# Patient Record
Sex: Female | Born: 1970 | Race: White | Hispanic: No | Marital: Married | Smoking: Current every day smoker
Health system: Southern US, Community
[De-identification: ages and names within clinical notes are randomized; demographics above are authoritative.]

## PROBLEM LIST (undated history)

## (undated) DIAGNOSIS — K219 Gastro-esophageal reflux disease without esophagitis: Secondary | ICD-10-CM

## (undated) DIAGNOSIS — K859 Acute pancreatitis without necrosis or infection, unspecified: Secondary | ICD-10-CM

## (undated) DIAGNOSIS — J449 Chronic obstructive pulmonary disease, unspecified: Secondary | ICD-10-CM

## (undated) DIAGNOSIS — F909 Attention-deficit hyperactivity disorder, unspecified type: Secondary | ICD-10-CM

## (undated) DIAGNOSIS — I671 Cerebral aneurysm, nonruptured: Secondary | ICD-10-CM

## (undated) DIAGNOSIS — R569 Unspecified convulsions: Secondary | ICD-10-CM

## (undated) DIAGNOSIS — K7460 Unspecified cirrhosis of liver: Secondary | ICD-10-CM

## (undated) DIAGNOSIS — T1491XA Suicide attempt, initial encounter: Secondary | ICD-10-CM

## (undated) DIAGNOSIS — F419 Anxiety disorder, unspecified: Secondary | ICD-10-CM

## (undated) DIAGNOSIS — F319 Bipolar disorder, unspecified: Secondary | ICD-10-CM

## (undated) HISTORY — PX: BRAIN SURGERY: SHX531

## (undated) HISTORY — PX: OTHER SURGICAL HISTORY: SHX169

---

## 2008-01-05 ENCOUNTER — Ambulatory Visit: Payer: Self-pay | Admitting: Family Medicine

## 2008-01-07 ENCOUNTER — Ambulatory Visit: Payer: Self-pay | Admitting: Family Medicine

## 2008-02-05 ENCOUNTER — Ambulatory Visit: Payer: Self-pay | Admitting: Obstetrics and Gynecology

## 2008-02-12 ENCOUNTER — Ambulatory Visit: Payer: Self-pay | Admitting: Obstetrics and Gynecology

## 2008-03-19 ENCOUNTER — Inpatient Hospital Stay: Payer: Self-pay | Admitting: Unknown Physician Specialty

## 2009-04-30 ENCOUNTER — Inpatient Hospital Stay: Payer: Self-pay | Admitting: Internal Medicine

## 2009-12-16 ENCOUNTER — Inpatient Hospital Stay: Payer: Self-pay | Admitting: Psychiatry

## 2010-06-23 ENCOUNTER — Inpatient Hospital Stay: Payer: Self-pay | Admitting: Psychiatry

## 2010-11-25 ENCOUNTER — Inpatient Hospital Stay: Payer: Self-pay | Admitting: Psychiatry

## 2011-01-05 ENCOUNTER — Emergency Department: Payer: Self-pay | Admitting: Emergency Medicine

## 2011-02-19 ENCOUNTER — Emergency Department: Payer: Self-pay | Admitting: *Deleted

## 2011-03-07 ENCOUNTER — Emergency Department: Payer: Self-pay | Admitting: Emergency Medicine

## 2011-06-19 ENCOUNTER — Emergency Department: Payer: Self-pay | Admitting: *Deleted

## 2011-07-30 ENCOUNTER — Emergency Department: Payer: Self-pay | Admitting: Emergency Medicine

## 2011-07-30 LAB — URINALYSIS, COMPLETE
Nitrite: NEGATIVE
Ph: 5 (ref 4.5–8.0)
Squamous Epithelial: 3

## 2011-08-09 ENCOUNTER — Emergency Department: Payer: Self-pay | Admitting: Emergency Medicine

## 2011-08-09 LAB — URINALYSIS, COMPLETE
Bilirubin,UR: NEGATIVE
Blood: NEGATIVE
Ketone: NEGATIVE
Ph: 5 (ref 4.5–8.0)
Protein: NEGATIVE
RBC,UR: <1 /HPF (ref 0–5)
Specific Gravity: 1.005 (ref 1.003–1.030)
Squamous Epithelial: 8

## 2011-08-09 LAB — DRUG SCREEN, URINE
Amphetamines, Ur Screen: NEGATIVE (ref ?–1000)
Benzodiazepine, Ur Scrn: NEGATIVE (ref ?–200)
Cannabinoid 50 Ng, Ur ~~LOC~~: NEGATIVE (ref ?–50)
MDMA (Ecstasy)Ur Screen: NEGATIVE (ref ?–500)
Methadone, Ur Screen: NEGATIVE (ref ?–300)
Opiate, Ur Screen: NEGATIVE (ref ?–300)

## 2011-08-10 LAB — COMPREHENSIVE METABOLIC PANEL
Anion Gap: 12 (ref 7–16)
BUN: 8 mg/dL (ref 7–18)
Chloride: 110 mmol/L — ABNORMAL HIGH (ref 98–107)
Creatinine: 0.61 mg/dL (ref 0.60–1.30)
EGFR (African American): >60
Glucose: 93 mg/dL (ref 65–99)
Osmolality: 296 (ref 275–301)
SGOT(AST): 51 U/L — ABNORMAL HIGH (ref 15–37)
SGPT (ALT): 44 U/L
Sodium: 150 mmol/L — ABNORMAL HIGH (ref 136–145)
Total Protein: 7.8 g/dL (ref 6.4–8.2)

## 2011-08-10 LAB — CBC
HCT: 44.2 % (ref 35.0–47.0)
HGB: 15 g/dL (ref 12.0–16.0)
MCHC: 33.9 g/dL (ref 32.0–36.0)
MCV: 105 fL — ABNORMAL HIGH (ref 80–100)
WBC: 11 10*3/uL (ref 3.6–11.0)

## 2011-08-10 LAB — ETHANOL: Ethanol: 267 mg/dL

## 2011-08-11 ENCOUNTER — Emergency Department: Payer: Self-pay | Admitting: Emergency Medicine

## 2011-08-11 LAB — URINALYSIS, COMPLETE
Bilirubin,UR: NEGATIVE
Blood: NEGATIVE
Glucose,UR: NEGATIVE mg/dL (ref 0–75)
Ketone: NEGATIVE
Ph: 8 (ref 4.5–8.0)
Specific Gravity: 1.021 (ref 1.003–1.030)
Squamous Epithelial: 41
WBC UR: 1 /HPF (ref 0–5)

## 2011-08-11 LAB — DRUG SCREEN, URINE
Barbiturates, Ur Screen: NEGATIVE (ref ?–200)
Benzodiazepine, Ur Scrn: NEGATIVE (ref ?–200)
Cannabinoid 50 Ng, Ur ~~LOC~~: NEGATIVE (ref ?–50)
Cocaine Metabolite,Ur ~~LOC~~: NEGATIVE (ref ?–300)
MDMA (Ecstasy)Ur Screen: NEGATIVE (ref ?–500)
Opiate, Ur Screen: POSITIVE (ref ?–300)
Tricyclic, Ur Screen: NEGATIVE (ref ?–1000)

## 2011-08-11 LAB — CBC
HCT: 43.2 % (ref 35.0–47.0)
HGB: 14.3 g/dL (ref 12.0–16.0)
MCH: 34.3 pg — ABNORMAL HIGH (ref 26.0–34.0)
MCHC: 33 g/dL (ref 32.0–36.0)
MCV: 104 fL — ABNORMAL HIGH (ref 80–100)
Platelet: 224 10*3/uL (ref 150–440)
RBC: 4.16 10*6/uL (ref 3.80–5.20)
RDW: 13.8 % (ref 11.5–14.5)
WBC: 6.1 10*3/uL (ref 3.6–11.0)

## 2011-08-11 LAB — COMPREHENSIVE METABOLIC PANEL
Albumin: 3.7 g/dL (ref 3.4–5.0)
Alkaline Phosphatase: 80 U/L (ref 50–136)
Anion Gap: 13 (ref 7–16)
BUN: 7 mg/dL (ref 7–18)
Calcium, Total: 8.5 mg/dL (ref 8.5–10.1)
Chloride: 104 mmol/L (ref 98–107)
EGFR (African American): >60
EGFR (Non-African Amer.): >60
Glucose: 94 mg/dL (ref 65–99)
Osmolality: 281 (ref 275–301)
Potassium: 3.7 mmol/L (ref 3.5–5.1)
SGOT(AST): 60 U/L — ABNORMAL HIGH (ref 15–37)
Sodium: 142 mmol/L (ref 136–145)

## 2011-08-11 LAB — TSH: Thyroid Stimulating Horm: 3.29 u[IU]/mL

## 2012-03-25 ENCOUNTER — Inpatient Hospital Stay: Payer: Self-pay | Admitting: Family Medicine

## 2012-03-25 LAB — COMPREHENSIVE METABOLIC PANEL
Albumin: 3.3 g/dL — ABNORMAL LOW (ref 3.4–5.0)
Alkaline Phosphatase: 149 U/L — ABNORMAL HIGH (ref 50–136)
Bilirubin,Total: 0.4 mg/dL (ref 0.2–1.0)
Calcium, Total: 8.1 mg/dL — ABNORMAL LOW (ref 8.5–10.1)
Chloride: 105 mmol/L (ref 98–107)
Co2: 27 mmol/L (ref 21–32)
Creatinine: 0.49 mg/dL — ABNORMAL LOW (ref 0.60–1.30)
EGFR (African American): >60
Osmolality: 280 (ref 275–301)
Potassium: 3.8 mmol/L (ref 3.5–5.1)
SGPT (ALT): 61 U/L (ref 12–78)
Sodium: 140 mmol/L (ref 136–145)
Total Protein: 6.9 g/dL (ref 6.4–8.2)

## 2012-03-25 LAB — APTT: Activated PTT: 31.5 secs (ref 23.6–35.9)

## 2012-03-25 LAB — URINALYSIS, COMPLETE
Bilirubin,UR: NEGATIVE
Glucose,UR: NEGATIVE mg/dL (ref 0–75)
Specific Gravity: 1.012 (ref 1.003–1.030)
WBC UR: 173 /HPF (ref 0–5)

## 2012-03-25 LAB — DRUG SCREEN, URINE
Amphetamines, Ur Screen: NEGATIVE (ref ?–1000)
Barbiturates, Ur Screen: NEGATIVE (ref ?–200)
Benzodiazepine, Ur Scrn: NEGATIVE (ref ?–200)
Cocaine Metabolite,Ur ~~LOC~~: NEGATIVE (ref ?–300)
MDMA (Ecstasy)Ur Screen: NEGATIVE (ref ?–500)
Opiate, Ur Screen: NEGATIVE (ref ?–300)
Phencyclidine (PCP) Ur S: NEGATIVE (ref ?–25)
Tricyclic, Ur Screen: POSITIVE (ref ?–1000)

## 2012-03-25 LAB — TSH: Thyroid Stimulating Horm: 1.45 u[IU]/mL

## 2012-03-25 LAB — CBC WITH DIFFERENTIAL/PLATELET
Basophil #: 0 10*3/uL (ref 0.0–0.1)
Basophil %: 0.1 %
Eosinophil %: 0 %
HGB: 11.7 g/dL — ABNORMAL LOW (ref 12.0–16.0)
Lymphocyte #: 0.4 10*3/uL — ABNORMAL LOW (ref 1.0–3.6)
MCH: 32.9 pg (ref 26.0–34.0)
MCV: 96 fL (ref 80–100)
Monocyte #: 0.7 x10 3/mm (ref 0.2–0.9)
Platelet: 118 10*3/uL — ABNORMAL LOW (ref 150–440)
RBC: 3.57 10*6/uL — ABNORMAL LOW (ref 3.80–5.20)
RDW: 16 % — ABNORMAL HIGH (ref 11.5–14.5)

## 2012-03-25 LAB — ACETAMINOPHEN LEVEL: Acetaminophen: <2 ug/mL

## 2012-03-25 LAB — ETHANOL
Ethanol %: <0.003 % (ref 0.000–0.080)
Ethanol: <3 mg/dL

## 2012-03-25 LAB — CK: CK, Total: 921 U/L — ABNORMAL HIGH (ref 21–215)

## 2012-03-25 LAB — TROPONIN I: Troponin-I: <0.02 ng/mL

## 2012-03-25 LAB — PREGNANCY, URINE: Pregnancy Test, Urine: NEGATIVE m[IU]/mL

## 2012-03-25 LAB — RAPID HIV-1/2 QL/CONFIRM: HIV-1/2,Rapid Ql: NEGATIVE

## 2012-03-26 LAB — CBC WITH DIFFERENTIAL/PLATELET
Basophil #: 0 10*3/uL (ref 0.0–0.1)
Basophil %: 0.3 %
Eosinophil #: 0 10*3/uL (ref 0.0–0.7)
Eosinophil %: 0.2 %
HCT: 32.4 % — ABNORMAL LOW (ref 35.0–47.0)
HGB: 10.9 g/dL — ABNORMAL LOW (ref 12.0–16.0)
Lymphocyte #: 1.5 10*3/uL (ref 1.0–3.6)
MCH: 33.4 pg (ref 26.0–34.0)
MCHC: 33.6 g/dL (ref 32.0–36.0)
MCV: 99 fL (ref 80–100)
Monocyte #: 1.2 x10 3/mm — ABNORMAL HIGH (ref 0.2–0.9)
Neutrophil #: 11.7 10*3/uL — ABNORMAL HIGH (ref 1.4–6.5)
Neutrophil %: 80.9 %
RDW: 16.3 % — ABNORMAL HIGH (ref 11.5–14.5)

## 2012-03-26 LAB — BASIC METABOLIC PANEL
BUN: 7 mg/dL (ref 7–18)
Calcium, Total: 8.2 mg/dL — ABNORMAL LOW (ref 8.5–10.1)
Creatinine: 0.63 mg/dL (ref 0.60–1.30)
EGFR (African American): >60
EGFR (Non-African Amer.): >60
Glucose: 85 mg/dL (ref 65–99)
Potassium: 3.3 mmol/L — ABNORMAL LOW (ref 3.5–5.1)
Sodium: 140 mmol/L (ref 136–145)

## 2012-03-27 LAB — CBC WITH DIFFERENTIAL/PLATELET
Basophil #: 0 10*3/uL (ref 0.0–0.1)
Basophil %: 0.1 %
Eosinophil #: 0.1 10*3/uL (ref 0.0–0.7)
HCT: 31.9 % — ABNORMAL LOW (ref 35.0–47.0)
MCV: 98 fL (ref 80–100)
Monocyte #: 1 x10 3/mm — ABNORMAL HIGH (ref 0.2–0.9)
Platelet: 118 10*3/uL — ABNORMAL LOW (ref 150–440)
RBC: 3.25 10*6/uL — ABNORMAL LOW (ref 3.80–5.20)
RDW: 16.2 % — ABNORMAL HIGH (ref 11.5–14.5)
WBC: 11.1 10*3/uL — ABNORMAL HIGH (ref 3.6–11.0)

## 2012-03-27 LAB — COMPREHENSIVE METABOLIC PANEL
Anion Gap: 9 (ref 7–16)
Calcium, Total: 7.7 mg/dL — ABNORMAL LOW (ref 8.5–10.1)
Co2: 24 mmol/L (ref 21–32)
EGFR (African American): >60
EGFR (Non-African Amer.): >60
Osmolality: 282 (ref 275–301)
Potassium: 3.3 mmol/L — ABNORMAL LOW (ref 3.5–5.1)
Sodium: 141 mmol/L (ref 136–145)

## 2012-03-27 LAB — TRIGLYCERIDES: Triglycerides: 137 mg/dL (ref 0–200)

## 2012-03-28 LAB — COMPREHENSIVE METABOLIC PANEL
Alkaline Phosphatase: 154 U/L — ABNORMAL HIGH (ref 50–136)
Anion Gap: 5 — ABNORMAL LOW (ref 7–16)
BUN: 5 mg/dL — ABNORMAL LOW (ref 7–18)
Bilirubin,Total: 0.5 mg/dL (ref 0.2–1.0)
Calcium, Total: 8.4 mg/dL — ABNORMAL LOW (ref 8.5–10.1)
Chloride: 111 mmol/L — ABNORMAL HIGH (ref 98–107)
Co2: 29 mmol/L (ref 21–32)
Creatinine: 0.89 mg/dL (ref 0.60–1.30)
EGFR (African American): >60
EGFR (Non-African Amer.): >60
Osmolality: 286 (ref 275–301)
Potassium: 3.5 mmol/L (ref 3.5–5.1)
SGOT(AST): 83 U/L — ABNORMAL HIGH (ref 15–37)
SGPT (ALT): 67 U/L (ref 12–78)
Sodium: 145 mmol/L (ref 136–145)
Total Protein: 6 g/dL — ABNORMAL LOW (ref 6.4–8.2)

## 2012-03-28 LAB — CBC WITH DIFFERENTIAL/PLATELET
Basophil #: 0 10*3/uL (ref 0.0–0.1)
Basophil %: 0.2 %
Eosinophil %: 0.4 %
HGB: 11.2 g/dL — ABNORMAL LOW (ref 12.0–16.0)
Lymphocyte %: 12.2 %
Monocyte %: 14 %
Neutrophil %: 73.2 %
Platelet: 178 10*3/uL (ref 150–440)
RBC: 3.38 10*6/uL — ABNORMAL LOW (ref 3.80–5.20)
WBC: 12.2 10*3/uL — ABNORMAL HIGH (ref 3.6–11.0)

## 2012-03-28 LAB — MAGNESIUM: Magnesium: 2.1 mg/dL

## 2012-03-29 LAB — MAGNESIUM: Magnesium: 1.9 mg/dL

## 2012-03-29 LAB — CBC WITH DIFFERENTIAL/PLATELET
Basophil #: 0 10*3/uL (ref 0.0–0.1)
Eosinophil #: 0.1 10*3/uL (ref 0.0–0.7)
Eosinophil %: 0.7 %
HCT: 34.8 % — ABNORMAL LOW (ref 35.0–47.0)
HGB: 11.7 g/dL — ABNORMAL LOW (ref 12.0–16.0)
MCH: 32.7 pg (ref 26.0–34.0)
MCHC: 33.7 g/dL (ref 32.0–36.0)
Monocyte %: 19.5 %
Neutrophil #: 5.6 10*3/uL (ref 1.4–6.5)
Neutrophil %: 59.6 %
Platelet: 251 10*3/uL (ref 150–440)
RBC: 3.58 10*6/uL — ABNORMAL LOW (ref 3.80–5.20)
RDW: 15.6 % — ABNORMAL HIGH (ref 11.5–14.5)
WBC: 9.3 10*3/uL (ref 3.6–11.0)

## 2012-03-29 LAB — BASIC METABOLIC PANEL
Anion Gap: 10 (ref 7–16)
BUN: 4 mg/dL — ABNORMAL LOW (ref 7–18)
Calcium, Total: 8.6 mg/dL (ref 8.5–10.1)
Co2: 25 mmol/L (ref 21–32)
Creatinine: 0.78 mg/dL (ref 0.60–1.30)
EGFR (African American): >60
EGFR (Non-African Amer.): >60
Glucose: 88 mg/dL (ref 65–99)
Potassium: 3.3 mmol/L — ABNORMAL LOW (ref 3.5–5.1)
Sodium: 141 mmol/L (ref 136–145)

## 2012-03-30 LAB — CULTURE, BLOOD (SINGLE)

## 2012-03-31 LAB — CULTURE, BLOOD (SINGLE)

## 2012-05-22 LAB — URINALYSIS, COMPLETE
Glucose,UR: NEGATIVE mg/dL (ref 0–75)
Nitrite: NEGATIVE
Protein: NEGATIVE
RBC,UR: 1 /HPF (ref 0–5)
Specific Gravity: 1.006 (ref 1.003–1.030)
Squamous Epithelial: 10

## 2012-05-22 LAB — ACETAMINOPHEN LEVEL: Acetaminophen: <2 ug/mL

## 2012-05-22 LAB — COMPREHENSIVE METABOLIC PANEL
Alkaline Phosphatase: 88 U/L (ref 50–136)
Calcium, Total: 8.5 mg/dL (ref 8.5–10.1)
Chloride: 111 mmol/L — ABNORMAL HIGH (ref 98–107)
Co2: 24 mmol/L (ref 21–32)
EGFR (African American): >60
EGFR (Non-African Amer.): >60
Osmolality: 289 (ref 275–301)
SGPT (ALT): 37 U/L (ref 12–78)

## 2012-05-22 LAB — DRUG SCREEN, URINE
Opiate, Ur Screen: NEGATIVE (ref ?–300)
Phencyclidine (PCP) Ur S: NEGATIVE (ref ?–25)

## 2012-05-22 LAB — ETHANOL: Ethanol %: 0.294 % — ABNORMAL HIGH (ref 0.000–0.080)

## 2012-05-22 LAB — CBC
HCT: 41.6 % (ref 35.0–47.0)
HGB: 14.2 g/dL (ref 12.0–16.0)
MCH: 31.7 pg (ref 26.0–34.0)
Platelet: 302 10*3/uL (ref 150–440)
RDW: 18.1 % — ABNORMAL HIGH (ref 11.5–14.5)
WBC: 7 10*3/uL (ref 3.6–11.0)

## 2012-05-22 LAB — SALICYLATE LEVEL: Salicylates, Serum: 5 mg/dL — ABNORMAL HIGH

## 2012-05-22 LAB — TSH: Thyroid Stimulating Horm: 0.423 u[IU]/mL — ABNORMAL LOW

## 2012-05-23 ENCOUNTER — Inpatient Hospital Stay: Payer: Self-pay | Admitting: Psychiatry

## 2012-05-23 LAB — BASIC METABOLIC PANEL
Anion Gap: 12 (ref 7–16)
BUN: 8 mg/dL (ref 7–18)
Chloride: 112 mmol/L — ABNORMAL HIGH (ref 98–107)
Co2: 23 mmol/L (ref 21–32)
Creatinine: 0.84 mg/dL (ref 0.60–1.30)
Potassium: 3.7 mmol/L (ref 3.5–5.1)
Sodium: 147 mmol/L — ABNORMAL HIGH (ref 136–145)

## 2012-05-23 LAB — TSH: Thyroid Stimulating Horm: 0.339 u[IU]/mL — ABNORMAL LOW

## 2012-05-24 LAB — LIPID PANEL
HDL Cholesterol: 90 mg/dL — ABNORMAL HIGH (ref 40–60)
Ldl Cholesterol, Calc: 80 mg/dL (ref 0–100)
Triglycerides: 138 mg/dL (ref 0–200)
VLDL Cholesterol, Calc: 28 mg/dL (ref 5–40)

## 2012-05-26 LAB — BASIC METABOLIC PANEL
Anion Gap: 7 (ref 7–16)
BUN: 9 mg/dL (ref 7–18)
Chloride: 108 mmol/L — ABNORMAL HIGH (ref 98–107)
Co2: 26 mmol/L (ref 21–32)
Osmolality: 280 (ref 275–301)

## 2012-05-28 LAB — BASIC METABOLIC PANEL
Anion Gap: 7 (ref 7–16)
BUN: 12 mg/dL (ref 7–18)
Calcium, Total: 9.7 mg/dL (ref 8.5–10.1)
Chloride: 103 mmol/L (ref 98–107)
EGFR (African American): >60
EGFR (Non-African Amer.): >60
Osmolality: 277 (ref 275–301)
Sodium: 139 mmol/L (ref 136–145)

## 2012-06-28 ENCOUNTER — Inpatient Hospital Stay: Payer: Self-pay | Admitting: Internal Medicine

## 2012-06-28 LAB — CBC
HGB: 14.6 g/dL (ref 12.0–16.0)
MCH: 31.7 pg (ref 26.0–34.0)
MCV: 94 fL (ref 80–100)
RBC: 4.6 10*6/uL (ref 3.80–5.20)
RDW: 19.6 % — ABNORMAL HIGH (ref 11.5–14.5)
WBC: 23.4 10*3/uL — ABNORMAL HIGH (ref 3.6–11.0)

## 2012-06-28 LAB — DRUG SCREEN, URINE
Cannabinoid 50 Ng, Ur ~~LOC~~: NEGATIVE (ref ?–50)
Cocaine Metabolite,Ur ~~LOC~~: POSITIVE (ref ?–300)
MDMA (Ecstasy)Ur Screen: NEGATIVE (ref ?–500)
Methadone, Ur Screen: NEGATIVE (ref ?–300)
Opiate, Ur Screen: NEGATIVE (ref ?–300)
Phencyclidine (PCP) Ur S: NEGATIVE (ref ?–25)

## 2012-06-28 LAB — URINALYSIS, COMPLETE
Bilirubin,UR: NEGATIVE
Nitrite: NEGATIVE
Protein: 100
RBC,UR: 1 /HPF (ref 0–5)
Squamous Epithelial: 18
WBC UR: 1 /HPF (ref 0–5)

## 2012-06-28 LAB — ETHANOL
Ethanol %: <0.003 % (ref 0.000–0.080)
Ethanol: <3 mg/dL

## 2012-06-28 LAB — COMPREHENSIVE METABOLIC PANEL
Anion Gap: 23 — ABNORMAL HIGH (ref 7–16)
BUN: 15 mg/dL (ref 7–18)
Bilirubin,Total: 1.7 mg/dL — ABNORMAL HIGH (ref 0.2–1.0)
Chloride: 102 mmol/L (ref 98–107)
Co2: 14 mmol/L — ABNORMAL LOW (ref 21–32)
Creatinine: 0.68 mg/dL (ref 0.60–1.30)
EGFR (African American): >60
Potassium: 5.3 mmol/L — ABNORMAL HIGH (ref 3.5–5.1)
SGPT (ALT): 57 U/L (ref 12–78)
Sodium: 139 mmol/L (ref 136–145)
Total Protein: 8.7 g/dL — ABNORMAL HIGH (ref 6.4–8.2)

## 2012-06-28 LAB — LIPASE, BLOOD: Lipase: 81 U/L (ref 73–393)

## 2012-06-28 LAB — PHOSPHORUS: Phosphorus: 3.6 mg/dL (ref 2.5–4.9)

## 2012-06-28 LAB — MAGNESIUM: Magnesium: 1.5 mg/dL — ABNORMAL LOW

## 2012-06-29 LAB — CBC WITH DIFFERENTIAL/PLATELET
Basophil #: 0 10*3/uL (ref 0.0–0.1)
Basophil %: 0.4 %
Eosinophil #: 0.1 10*3/uL (ref 0.0–0.7)
Eosinophil %: 1.4 %
Lymphocyte %: 32 %
MCHC: 33.7 g/dL (ref 32.0–36.0)
MCV: 94 fL (ref 80–100)
Monocyte #: 0.4 x10 3/mm (ref 0.2–0.9)
Neutrophil #: 4.3 10*3/uL (ref 1.4–6.5)
Neutrophil %: 60.7 %
RDW: 19.5 % — ABNORMAL HIGH (ref 11.5–14.5)
WBC: 7.2 10*3/uL (ref 3.6–11.0)

## 2012-06-29 LAB — COMPREHENSIVE METABOLIC PANEL
Albumin: 3.1 g/dL — ABNORMAL LOW (ref 3.4–5.0)
Alkaline Phosphatase: 92 U/L (ref 50–136)
Anion Gap: 5 — ABNORMAL LOW (ref 7–16)
BUN: 10 mg/dL (ref 7–18)
Calcium, Total: 7.7 mg/dL — ABNORMAL LOW (ref 8.5–10.1)
Creatinine: 0.5 mg/dL — ABNORMAL LOW (ref 0.60–1.30)
EGFR (African American): >60
EGFR (Non-African Amer.): >60
Glucose: 89 mg/dL (ref 65–99)
Potassium: 3.6 mmol/L (ref 3.5–5.1)
SGOT(AST): 62 U/L — ABNORMAL HIGH (ref 15–37)
SGPT (ALT): 35 U/L (ref 12–78)
Sodium: 136 mmol/L (ref 136–145)
Total Protein: 6 g/dL — ABNORMAL LOW (ref 6.4–8.2)

## 2012-06-29 LAB — BASIC METABOLIC PANEL
Chloride: 108 mmol/L — ABNORMAL HIGH (ref 98–107)
Co2: 22 mmol/L (ref 21–32)
Creatinine: 0.53 mg/dL — ABNORMAL LOW (ref 0.60–1.30)
EGFR (Non-African Amer.): >60
Osmolality: 272 (ref 275–301)
Sodium: 137 mmol/L (ref 136–145)

## 2012-06-29 LAB — PHOSPHORUS: Phosphorus: 0.9 mg/dL — CL (ref 2.5–4.9)

## 2012-06-29 LAB — MAGNESIUM: Magnesium: 1.8 mg/dL

## 2012-06-30 LAB — PHOSPHORUS: Phosphorus: 2.4 mg/dL — ABNORMAL LOW (ref 2.5–4.9)

## 2012-08-22 ENCOUNTER — Emergency Department: Payer: Self-pay | Admitting: Emergency Medicine

## 2012-08-22 LAB — URINALYSIS, COMPLETE
Nitrite: NEGATIVE
Ph: 5 (ref 4.5–8.0)
Protein: 100
RBC,UR: 8 /HPF (ref 0–5)
Specific Gravity: 1.029 (ref 1.003–1.030)
WBC UR: 9 /HPF (ref 0–5)

## 2012-08-22 LAB — LIPASE, BLOOD: Lipase: 108 U/L (ref 73–393)

## 2012-08-22 LAB — COMPREHENSIVE METABOLIC PANEL
Alkaline Phosphatase: 157 U/L — ABNORMAL HIGH (ref 50–136)
BUN: 8 mg/dL (ref 7–18)
Calcium, Total: 9.3 mg/dL (ref 8.5–10.1)
Creatinine: 0.68 mg/dL (ref 0.60–1.30)
Glucose: 116 mg/dL — ABNORMAL HIGH (ref 65–99)
SGOT(AST): 54 U/L — ABNORMAL HIGH (ref 15–37)
SGPT (ALT): 28 U/L (ref 12–78)
Sodium: 139 mmol/L (ref 136–145)
Total Protein: 8.2 g/dL (ref 6.4–8.2)

## 2012-08-22 LAB — ETHANOL
Ethanol %: <0.003 % (ref 0.000–0.080)
Ethanol: <3 mg/dL

## 2012-08-22 LAB — CBC
HCT: 44.2 % (ref 35.0–47.0)
HGB: 15.1 g/dL (ref 12.0–16.0)
RBC: 4.6 10*6/uL (ref 3.80–5.20)
RDW: 14.2 % (ref 11.5–14.5)
WBC: 10.3 10*3/uL (ref 3.6–11.0)

## 2012-08-23 ENCOUNTER — Emergency Department: Payer: Self-pay | Admitting: Internal Medicine

## 2012-08-24 ENCOUNTER — Emergency Department: Payer: Self-pay | Admitting: Emergency Medicine

## 2012-08-24 LAB — URINALYSIS, COMPLETE
Bacteria: NONE SEEN
Blood: NEGATIVE
Nitrite: POSITIVE
Protein: 30
Specific Gravity: 1.023 (ref 1.003–1.030)
Squamous Epithelial: 34
WBC UR: 3 /HPF (ref 0–5)

## 2012-08-24 LAB — CBC
HCT: 42.3 % (ref 35.0–47.0)
HGB: 14.4 g/dL (ref 12.0–16.0)
MCH: 32.7 pg (ref 26.0–34.0)
MCHC: 34.1 g/dL (ref 32.0–36.0)
RBC: 4.4 10*6/uL (ref 3.80–5.20)

## 2012-08-24 LAB — COMPREHENSIVE METABOLIC PANEL
Albumin: 4.1 g/dL (ref 3.4–5.0)
Alkaline Phosphatase: 121 U/L (ref 50–136)
Anion Gap: 7 (ref 7–16)
BUN: 5 mg/dL — ABNORMAL LOW (ref 7–18)
Calcium, Total: 9.1 mg/dL (ref 8.5–10.1)
Creatinine: 0.64 mg/dL (ref 0.60–1.30)
EGFR (African American): >60
Osmolality: 275 (ref 275–301)
Potassium: 3.1 mmol/L — ABNORMAL LOW (ref 3.5–5.1)
SGPT (ALT): 28 U/L (ref 12–78)
Total Protein: 7.7 g/dL (ref 6.4–8.2)

## 2012-08-24 LAB — LIPASE, BLOOD: Lipase: 194 U/L (ref 73–393)

## 2012-09-30 ENCOUNTER — Emergency Department: Payer: Self-pay | Admitting: Emergency Medicine

## 2012-09-30 LAB — DRUG SCREEN, URINE
Amphetamines, Ur Screen: NEGATIVE (ref ?–1000)
Cannabinoid 50 Ng, Ur ~~LOC~~: NEGATIVE (ref ?–50)
Cocaine Metabolite,Ur ~~LOC~~: NEGATIVE (ref ?–300)
Methadone, Ur Screen: NEGATIVE (ref ?–300)
Opiate, Ur Screen: POSITIVE (ref ?–300)
Tricyclic, Ur Screen: NEGATIVE (ref ?–1000)

## 2012-09-30 LAB — URINALYSIS, COMPLETE
Blood: NEGATIVE
Protein: 30
RBC,UR: 2 /HPF (ref 0–5)
Specific Gravity: 1.026 (ref 1.003–1.030)
WBC UR: 3 /HPF (ref 0–5)

## 2012-09-30 LAB — TSH: Thyroid Stimulating Horm: 1.05 u[IU]/mL

## 2012-09-30 LAB — COMPREHENSIVE METABOLIC PANEL
Albumin: 4.3 g/dL (ref 3.4–5.0)
Alkaline Phosphatase: 155 U/L — ABNORMAL HIGH (ref 50–136)
Anion Gap: 11 (ref 7–16)
Bilirubin,Total: 0.9 mg/dL (ref 0.2–1.0)
Calcium, Total: 8.4 mg/dL — ABNORMAL LOW (ref 8.5–10.1)
Co2: 22 mmol/L (ref 21–32)
Creatinine: 0.25 mg/dL — ABNORMAL LOW (ref 0.60–1.30)
EGFR (Non-African Amer.): >60
Glucose: 86 mg/dL (ref 65–99)
Osmolality: 278 (ref 275–301)
SGOT(AST): 195 U/L — ABNORMAL HIGH (ref 15–37)
SGPT (ALT): 69 U/L (ref 12–78)
Sodium: 140 mmol/L (ref 136–145)
Total Protein: 8.1 g/dL (ref 6.4–8.2)

## 2012-09-30 LAB — ACETAMINOPHEN LEVEL: Acetaminophen: <2 ug/mL

## 2012-09-30 LAB — ETHANOL: Ethanol: <3 mg/dL

## 2012-09-30 LAB — LIPASE, BLOOD: Lipase: 74 U/L (ref 73–393)

## 2012-10-09 ENCOUNTER — Emergency Department: Payer: Self-pay | Admitting: Emergency Medicine

## 2012-10-09 LAB — DRUG SCREEN, URINE
Barbiturates, Ur Screen: NEGATIVE (ref ?–200)
Benzodiazepine, Ur Scrn: NEGATIVE (ref ?–200)
Cocaine Metabolite,Ur ~~LOC~~: NEGATIVE (ref ?–300)
Methadone, Ur Screen: NEGATIVE (ref ?–300)
Phencyclidine (PCP) Ur S: NEGATIVE (ref ?–25)

## 2012-10-09 LAB — PREGNANCY, URINE: Pregnancy Test, Urine: NEGATIVE m[IU]/mL

## 2012-10-09 LAB — CBC
MCH: 31.7 pg (ref 26.0–34.0)
MCHC: 33 g/dL (ref 32.0–36.0)
MCV: 96 fL (ref 80–100)
RBC: 4.23 10*6/uL (ref 3.80–5.20)
RDW: 17.8 % — ABNORMAL HIGH (ref 11.5–14.5)
WBC: 5 10*3/uL (ref 3.6–11.0)

## 2012-10-09 LAB — URINALYSIS, COMPLETE
Bilirubin,UR: NEGATIVE
Glucose,UR: NEGATIVE mg/dL (ref 0–75)
RBC,UR: 1 /HPF (ref 0–5)
Squamous Epithelial: 4

## 2012-10-09 LAB — COMPREHENSIVE METABOLIC PANEL
Albumin: 3.4 g/dL (ref 3.4–5.0)
Alkaline Phosphatase: 128 U/L (ref 50–136)
Anion Gap: 8 (ref 7–16)
BUN: 5 mg/dL — ABNORMAL LOW (ref 7–18)
Bilirubin,Total: 0.4 mg/dL (ref 0.2–1.0)
Chloride: 112 mmol/L — ABNORMAL HIGH (ref 98–107)
Co2: 28 mmol/L (ref 21–32)
Creatinine: 0.53 mg/dL — ABNORMAL LOW (ref 0.60–1.30)
Glucose: 99 mg/dL (ref 65–99)
Osmolality: 292 (ref 275–301)
Potassium: 3.1 mmol/L — ABNORMAL LOW (ref 3.5–5.1)
SGOT(AST): 117 U/L — ABNORMAL HIGH (ref 15–37)
SGPT (ALT): 73 U/L (ref 12–78)
Sodium: 148 mmol/L — ABNORMAL HIGH (ref 136–145)

## 2012-10-09 LAB — ACETAMINOPHEN LEVEL: Acetaminophen: <2 ug/mL

## 2012-10-09 LAB — SALICYLATE LEVEL: Salicylates, Serum: <1.7 mg/dL

## 2012-10-22 ENCOUNTER — Emergency Department: Payer: Self-pay | Admitting: Emergency Medicine

## 2012-10-22 LAB — DRUG SCREEN, URINE
Amphetamines, Ur Screen: NEGATIVE (ref ?–1000)
Benzodiazepine, Ur Scrn: NEGATIVE (ref ?–200)
Cannabinoid 50 Ng, Ur ~~LOC~~: NEGATIVE (ref ?–50)
Cocaine Metabolite,Ur ~~LOC~~: NEGATIVE (ref ?–300)
MDMA (Ecstasy)Ur Screen: NEGATIVE (ref ?–500)
Methadone, Ur Screen: NEGATIVE (ref ?–300)
Opiate, Ur Screen: NEGATIVE (ref ?–300)
Tricyclic, Ur Screen: NEGATIVE (ref ?–1000)

## 2012-10-22 LAB — COMPREHENSIVE METABOLIC PANEL
Albumin: 5.1 g/dL — ABNORMAL HIGH (ref 3.4–5.0)
BUN: 7 mg/dL (ref 7–18)
Co2: 12 mmol/L — ABNORMAL LOW (ref 21–32)
SGPT (ALT): 82 U/L — ABNORMAL HIGH (ref 12–78)
Sodium: 131 mmol/L — ABNORMAL LOW (ref 136–145)
Total Protein: 9.8 g/dL — ABNORMAL HIGH (ref 6.4–8.2)

## 2012-10-22 LAB — CBC
HGB: 17.7 g/dL — ABNORMAL HIGH (ref 12.0–16.0)
MCHC: 32.6 g/dL (ref 32.0–36.0)
MCV: 97 fL (ref 80–100)
RDW: 17.8 % — ABNORMAL HIGH (ref 11.5–14.5)
WBC: 14.2 10*3/uL — ABNORMAL HIGH (ref 3.6–11.0)

## 2012-10-22 LAB — URINALYSIS, COMPLETE
Bacteria: NONE SEEN
Bilirubin,UR: NEGATIVE
Leukocyte Esterase: NEGATIVE
Nitrite: NEGATIVE
Protein: 100
Specific Gravity: 1.018 (ref 1.003–1.030)
Squamous Epithelial: 2

## 2013-01-31 ENCOUNTER — Emergency Department: Payer: Self-pay | Admitting: Emergency Medicine

## 2013-01-31 LAB — BASIC METABOLIC PANEL
Anion Gap: 7 (ref 7–16)
BUN: 10 mg/dL (ref 7–18)
Calcium, Total: 8.6 mg/dL (ref 8.5–10.1)
Chloride: 100 mmol/L (ref 98–107)
Co2: 30 mmol/L (ref 21–32)
EGFR (African American): >60
EGFR (Non-African Amer.): >60
Glucose: 102 mg/dL — ABNORMAL HIGH (ref 65–99)
Osmolality: 273 (ref 275–301)
Potassium: 3.4 mmol/L — ABNORMAL LOW (ref 3.5–5.1)

## 2013-01-31 LAB — URINALYSIS, COMPLETE
Blood: NEGATIVE
Glucose,UR: NEGATIVE mg/dL (ref 0–75)
Ketone: NEGATIVE
Nitrite: POSITIVE
RBC,UR: 2 /HPF (ref 0–5)
Specific Gravity: 1.03 (ref 1.003–1.030)
Squamous Epithelial: 5

## 2013-01-31 LAB — CBC WITH DIFFERENTIAL/PLATELET
Basophil #: 0 10*3/uL (ref 0.0–0.1)
Basophil %: 0.4 %
Eosinophil %: 0.8 %
HCT: 39.2 % (ref 35.0–47.0)
HGB: 13.4 g/dL (ref 12.0–16.0)
Lymphocyte #: 2 10*3/uL (ref 1.0–3.6)
Lymphocyte %: 21.1 %
MCH: 33.1 pg (ref 26.0–34.0)
MCHC: 34.3 g/dL (ref 32.0–36.0)
MCV: 97 fL (ref 80–100)
Neutrophil #: 6.9 10*3/uL — ABNORMAL HIGH (ref 1.4–6.5)
Neutrophil %: 71.4 %
Platelet: 89 10*3/uL — ABNORMAL LOW (ref 150–440)
RBC: 4.06 10*6/uL (ref 3.80–5.20)
RDW: 19.3 % — ABNORMAL HIGH (ref 11.5–14.5)
WBC: 9.7 10*3/uL (ref 3.6–11.0)

## 2013-01-31 LAB — DRUG SCREEN, URINE
Benzodiazepine, Ur Scrn: NEGATIVE (ref ?–200)
Cocaine Metabolite,Ur ~~LOC~~: NEGATIVE (ref ?–300)
MDMA (Ecstasy)Ur Screen: NEGATIVE (ref ?–500)
Methadone, Ur Screen: NEGATIVE (ref ?–300)
Opiate, Ur Screen: NEGATIVE (ref ?–300)
Phencyclidine (PCP) Ur S: NEGATIVE (ref ?–25)
Tricyclic, Ur Screen: NEGATIVE (ref ?–1000)

## 2013-01-31 LAB — ETHANOL: Ethanol %: <0.003 % (ref 0.000–0.080)

## 2013-02-02 LAB — URINE CULTURE

## 2013-02-08 ENCOUNTER — Emergency Department: Payer: Self-pay | Admitting: Emergency Medicine

## 2013-02-16 ENCOUNTER — Emergency Department: Payer: Self-pay | Admitting: Emergency Medicine

## 2013-02-16 LAB — CBC
HCT: 45.5 % (ref 35.0–47.0)
HGB: 15.1 g/dL (ref 12.0–16.0)
MCH: 32.2 pg (ref 26.0–34.0)
MCHC: 33.2 g/dL (ref 32.0–36.0)
RBC: 4.68 10*6/uL (ref 3.80–5.20)
RDW: 18 % — ABNORMAL HIGH (ref 11.5–14.5)
WBC: 8.3 10*3/uL (ref 3.6–11.0)

## 2013-02-16 LAB — COMPREHENSIVE METABOLIC PANEL
Albumin: 3.8 g/dL (ref 3.4–5.0)
Anion Gap: 12 (ref 7–16)
BUN: 5 mg/dL — ABNORMAL LOW (ref 7–18)
Calcium, Total: 8.9 mg/dL (ref 8.5–10.1)
Chloride: 96 mmol/L — ABNORMAL LOW (ref 98–107)
EGFR (African American): >60
EGFR (Non-African Amer.): >60
Osmolality: 268 (ref 275–301)
Potassium: 3 mmol/L — ABNORMAL LOW (ref 3.5–5.1)

## 2013-03-04 ENCOUNTER — Emergency Department: Payer: Self-pay | Admitting: Emergency Medicine

## 2013-03-04 LAB — DRUG SCREEN, URINE
Benzodiazepine, Ur Scrn: NEGATIVE (ref ?–200)
Cocaine Metabolite,Ur ~~LOC~~: NEGATIVE (ref ?–300)
MDMA (Ecstasy)Ur Screen: NEGATIVE (ref ?–500)
Methadone, Ur Screen: NEGATIVE (ref ?–300)
Opiate, Ur Screen: NEGATIVE (ref ?–300)
Phencyclidine (PCP) Ur S: NEGATIVE (ref ?–25)
Tricyclic, Ur Screen: NEGATIVE (ref ?–1000)

## 2013-03-04 LAB — COMPREHENSIVE METABOLIC PANEL
Albumin: 2.9 g/dL — ABNORMAL LOW (ref 3.4–5.0)
Alkaline Phosphatase: 223 U/L — ABNORMAL HIGH (ref 50–136)
Anion Gap: 9 (ref 7–16)
BUN: 5 mg/dL — ABNORMAL LOW (ref 7–18)
Calcium, Total: 8.2 mg/dL — ABNORMAL LOW (ref 8.5–10.1)
Chloride: 113 mmol/L — ABNORMAL HIGH (ref 98–107)
Co2: 23 mmol/L (ref 21–32)
Creatinine: 0.41 mg/dL — ABNORMAL LOW (ref 0.60–1.30)
EGFR (African American): >60
EGFR (Non-African Amer.): >60
Glucose: 83 mg/dL (ref 65–99)
Osmolality: 285 (ref 275–301)
Potassium: 3.6 mmol/L (ref 3.5–5.1)
SGOT(AST): 161 U/L — ABNORMAL HIGH (ref 15–37)
Sodium: 145 mmol/L (ref 136–145)
Total Protein: 6.8 g/dL (ref 6.4–8.2)

## 2013-03-04 LAB — CBC
HCT: 41 % (ref 35.0–47.0)
HGB: 13.8 g/dL (ref 12.0–16.0)
MCH: 33.7 pg (ref 26.0–34.0)
MCV: 100 fL (ref 80–100)
Platelet: 443 10*3/uL — ABNORMAL HIGH (ref 150–440)
WBC: 9.9 10*3/uL (ref 3.6–11.0)

## 2013-03-04 LAB — ETHANOL: Ethanol %: 0.293 % — ABNORMAL HIGH (ref 0.000–0.080)

## 2013-03-04 LAB — SALICYLATE LEVEL: Salicylates, Serum: <1.7 mg/dL

## 2013-04-18 LAB — DRUG SCREEN, URINE
Amphetamines, Ur Screen: NEGATIVE (ref ?–1000)
Cocaine Metabolite,Ur ~~LOC~~: NEGATIVE (ref ?–300)
Methadone, Ur Screen: NEGATIVE (ref ?–300)
Opiate, Ur Screen: NEGATIVE (ref ?–300)
Phencyclidine (PCP) Ur S: NEGATIVE (ref ?–25)
Tricyclic, Ur Screen: NEGATIVE (ref ?–1000)

## 2013-04-18 LAB — COMPREHENSIVE METABOLIC PANEL
Albumin: 3.8 g/dL (ref 3.4–5.0)
Alkaline Phosphatase: 328 U/L — ABNORMAL HIGH (ref 50–136)
Anion Gap: 11 (ref 7–16)
BUN: 5 mg/dL — ABNORMAL LOW (ref 7–18)
Creatinine: 0.46 mg/dL — ABNORMAL LOW (ref 0.60–1.30)
EGFR (Non-African Amer.): >60
Osmolality: 278 (ref 275–301)
Potassium: 3.2 mmol/L — ABNORMAL LOW (ref 3.5–5.1)
SGOT(AST): 313 U/L — ABNORMAL HIGH (ref 15–37)
SGPT (ALT): 66 U/L (ref 12–78)
Sodium: 140 mmol/L (ref 136–145)
Total Protein: 7.8 g/dL (ref 6.4–8.2)

## 2013-04-18 LAB — CBC
HCT: 41.8 % (ref 35.0–47.0)
MCH: 35.9 pg — ABNORMAL HIGH (ref 26.0–34.0)
MCHC: 34.5 g/dL (ref 32.0–36.0)
Platelet: 211 10*3/uL (ref 150–440)
RBC: 4.02 10*6/uL (ref 3.80–5.20)
RDW: 16.8 % — ABNORMAL HIGH (ref 11.5–14.5)
WBC: 8 10*3/uL (ref 3.6–11.0)

## 2013-04-18 LAB — URINALYSIS, COMPLETE
Blood: NEGATIVE
Glucose,UR: NEGATIVE mg/dL (ref 0–75)
Ketone: NEGATIVE
Nitrite: NEGATIVE
Specific Gravity: 1.005 (ref 1.003–1.030)
Squamous Epithelial: 13
WBC UR: 9 /HPF (ref 0–5)

## 2013-04-18 LAB — ETHANOL
Ethanol %: 0.344 % (ref 0.000–0.080)
Ethanol: 344 mg/dL

## 2013-04-19 ENCOUNTER — Inpatient Hospital Stay: Payer: Self-pay | Admitting: Psychiatry

## 2013-06-22 ENCOUNTER — Emergency Department: Payer: Self-pay | Admitting: Emergency Medicine

## 2013-06-29 ENCOUNTER — Inpatient Hospital Stay: Payer: Self-pay | Admitting: Internal Medicine

## 2013-06-29 LAB — ETHANOL: Ethanol %: <0.003 % (ref 0.000–0.080)

## 2013-06-29 LAB — URINALYSIS, COMPLETE
Bacteria: NONE SEEN
Blood: NEGATIVE
Glucose,UR: NEGATIVE mg/dL (ref 0–75)
Leukocyte Esterase: NEGATIVE
Nitrite: NEGATIVE
Ph: 5 (ref 4.5–8.0)
Protein: 100
Specific Gravity: 1.025 (ref 1.003–1.030)
Squamous Epithelial: 3
WBC UR: 16 /HPF (ref 0–5)

## 2013-06-29 LAB — HEPATIC FUNCTION PANEL A (ARMC)
Albumin: 3.4 g/dL (ref 3.4–5.0)
Bilirubin, Direct: 2.2 mg/dL — ABNORMAL HIGH (ref 0.00–0.20)
SGPT (ALT): 47 U/L (ref 12–78)

## 2013-06-29 LAB — COMPREHENSIVE METABOLIC PANEL
Alkaline Phosphatase: 267 U/L — ABNORMAL HIGH
Anion Gap: 12 (ref 7–16)
BUN: 9 mg/dL (ref 7–18)
Bilirubin,Total: 3.8 mg/dL — ABNORMAL HIGH (ref 0.2–1.0)
Creatinine: 0.45 mg/dL — ABNORMAL LOW (ref 0.60–1.30)
EGFR (African American): >60
Glucose: 86 mg/dL (ref 65–99)
Osmolality: 268 (ref 275–301)
Potassium: 3 mmol/L — ABNORMAL LOW (ref 3.5–5.1)
SGOT(AST): 180 U/L — ABNORMAL HIGH (ref 15–37)
SGPT (ALT): 48 U/L (ref 12–78)
Sodium: 135 mmol/L — ABNORMAL LOW (ref 136–145)
Total Protein: 7.1 g/dL (ref 6.4–8.2)

## 2013-06-29 LAB — CBC
HGB: 13.4 g/dL (ref 12.0–16.0)
MCH: 34.4 pg — ABNORMAL HIGH (ref 26.0–34.0)
MCHC: 34.3 g/dL (ref 32.0–36.0)
Platelet: 68 10*3/uL — ABNORMAL LOW (ref 150–440)
RBC: 3.89 10*6/uL (ref 3.80–5.20)
RDW: 21.6 % — ABNORMAL HIGH (ref 11.5–14.5)

## 2013-06-29 LAB — DRUG SCREEN, URINE
Barbiturates, Ur Screen: NEGATIVE (ref ?–200)
Benzodiazepine, Ur Scrn: POSITIVE (ref ?–200)
Cocaine Metabolite,Ur ~~LOC~~: NEGATIVE (ref ?–300)
Methadone, Ur Screen: NEGATIVE (ref ?–300)
Opiate, Ur Screen: NEGATIVE (ref ?–300)

## 2013-06-29 LAB — OCCULT BLOOD X 1 CARD TO LAB, STOOL: Occult Blood, Feces: NEGATIVE

## 2013-06-29 LAB — LIPASE, BLOOD: Lipase: 617 U/L — ABNORMAL HIGH (ref 73–393)

## 2013-06-30 LAB — BASIC METABOLIC PANEL
Anion Gap: 9 (ref 7–16)
BUN: 4 mg/dL — ABNORMAL LOW (ref 7–18)
Co2: 23 mmol/L (ref 21–32)
Creatinine: 0.53 mg/dL — ABNORMAL LOW (ref 0.60–1.30)
EGFR (African American): >60
EGFR (Non-African Amer.): >60
Osmolality: 268 (ref 275–301)
Potassium: 3.5 mmol/L (ref 3.5–5.1)
Sodium: 135 mmol/L — ABNORMAL LOW (ref 136–145)

## 2013-06-30 LAB — LIPASE, BLOOD: Lipase: 399 U/L — ABNORMAL HIGH (ref 73–393)

## 2013-06-30 LAB — MAGNESIUM: Magnesium: 2.9 mg/dL — ABNORMAL HIGH

## 2013-06-30 LAB — CLOSTRIDIUM DIFFICILE(ARMC)

## 2013-08-18 ENCOUNTER — Ambulatory Visit: Payer: Self-pay | Admitting: Internal Medicine

## 2013-08-24 ENCOUNTER — Ambulatory Visit: Payer: Self-pay | Admitting: Gastroenterology

## 2013-10-11 ENCOUNTER — Ambulatory Visit: Payer: Self-pay

## 2013-10-12 ENCOUNTER — Inpatient Hospital Stay: Payer: Self-pay | Admitting: Internal Medicine

## 2013-10-12 LAB — COMPREHENSIVE METABOLIC PANEL
ALBUMIN: 2.3 g/dL — AB (ref 3.4–5.0)
ALT: 48 U/L (ref 12–78)
ANION GAP: 6 — AB (ref 7–16)
AST: 159 U/L — AB (ref 15–37)
Alkaline Phosphatase: 332 U/L — ABNORMAL HIGH
BUN: 5 mg/dL — ABNORMAL LOW (ref 7–18)
Bilirubin,Total: 7.7 mg/dL — ABNORMAL HIGH (ref 0.2–1.0)
CALCIUM: 8.6 mg/dL (ref 8.5–10.1)
CHLORIDE: 103 mmol/L (ref 98–107)
CO2: 26 mmol/L (ref 21–32)
Creatinine: 0.39 mg/dL — ABNORMAL LOW (ref 0.60–1.30)
EGFR (Non-African Amer.): >60
Glucose: 86 mg/dL (ref 65–99)
Osmolality: 267 (ref 275–301)
POTASSIUM: 3.9 mmol/L (ref 3.5–5.1)
Sodium: 135 mmol/L — ABNORMAL LOW (ref 136–145)
Total Protein: 6.8 g/dL (ref 6.4–8.2)

## 2013-10-12 LAB — CBC WITH DIFFERENTIAL/PLATELET
Basophil #: 0.3 10*3/uL — ABNORMAL HIGH (ref 0.0–0.1)
Basophil %: 1.3 %
EOS ABS: 0.2 10*3/uL (ref 0.0–0.7)
EOS PCT: 1 %
HCT: 33.2 % — ABNORMAL LOW (ref 35.0–47.0)
HGB: 11.1 g/dL — ABNORMAL LOW (ref 12.0–16.0)
LYMPHS PCT: 5.7 %
Lymphocyte #: 1.2 10*3/uL (ref 1.0–3.6)
MCH: 34.7 pg — AB (ref 26.0–34.0)
MCHC: 33.5 g/dL (ref 32.0–36.0)
MCV: 104 fL — ABNORMAL HIGH (ref 80–100)
MONOS PCT: 13 %
Monocyte #: 2.7 x10 3/mm — ABNORMAL HIGH (ref 0.2–0.9)
Neutrophil #: 16.7 10*3/uL — ABNORMAL HIGH (ref 1.4–6.5)
Neutrophil %: 79 %
Platelet: 224 10*3/uL (ref 150–440)
RBC: 3.21 10*6/uL — ABNORMAL LOW (ref 3.80–5.20)
RDW: 20.1 % — ABNORMAL HIGH (ref 11.5–14.5)
WBC: 21.1 10*3/uL — ABNORMAL HIGH (ref 3.6–11.0)

## 2013-10-12 LAB — AMYLASE: AMYLASE: 16 U/L — AB (ref 25–115)

## 2013-10-12 LAB — LIPASE, BLOOD: Lipase: 84 U/L (ref 73–393)

## 2013-10-12 LAB — LACTATE DEHYDROGENASE: LDH: 247 U/L — AB (ref 81–246)

## 2013-10-12 LAB — APTT: Activated PTT: 30.3 secs (ref 23.6–35.9)

## 2013-10-12 LAB — AMMONIA: Ammonia, Plasma: 22 mcmol/L (ref 11–32)

## 2013-10-13 LAB — COMPREHENSIVE METABOLIC PANEL
ALBUMIN: 2.4 g/dL — AB (ref 3.4–5.0)
ANION GAP: 4 — AB (ref 7–16)
Alkaline Phosphatase: 319 U/L — ABNORMAL HIGH
BUN: 7 mg/dL (ref 7–18)
Bilirubin,Total: 9.3 mg/dL — ABNORMAL HIGH (ref 0.2–1.0)
CO2: 28 mmol/L (ref 21–32)
Calcium, Total: 8.6 mg/dL (ref 8.5–10.1)
Chloride: 102 mmol/L (ref 98–107)
Creatinine: 0.63 mg/dL (ref 0.60–1.30)
EGFR (African American): >60
EGFR (Non-African Amer.): >60
Glucose: 112 mg/dL — ABNORMAL HIGH (ref 65–99)
OSMOLALITY: 267 (ref 275–301)
Potassium: 3.8 mmol/L (ref 3.5–5.1)
SGOT(AST): 134 U/L — ABNORMAL HIGH (ref 15–37)
SGPT (ALT): 41 U/L (ref 12–78)
SODIUM: 134 mmol/L — AB (ref 136–145)
Total Protein: 7 g/dL (ref 6.4–8.2)

## 2013-10-13 LAB — URINALYSIS, COMPLETE
BLOOD: NEGATIVE
Bacteria: NONE SEEN
Glucose,UR: NEGATIVE mg/dL (ref 0–75)
Ketone: NEGATIVE
LEUKOCYTE ESTERASE: NEGATIVE
NITRITE: NEGATIVE
Ph: 5 (ref 4.5–8.0)
Protein: NEGATIVE
RBC,UR: NONE SEEN /HPF (ref 0–5)
Specific Gravity: 1.028 (ref 1.003–1.030)

## 2013-10-13 LAB — CBC WITH DIFFERENTIAL/PLATELET
BANDS NEUTROPHIL: 3 %
COMMENT - H1-COM5: NORMAL
EOS PCT: 1 %
HCT: 33.4 % — AB (ref 35.0–47.0)
HGB: 11.3 g/dL — ABNORMAL LOW (ref 12.0–16.0)
LYMPHS PCT: 15 %
MCH: 35.7 pg — AB (ref 26.0–34.0)
MCHC: 33.9 g/dL (ref 32.0–36.0)
MCV: 105 fL — AB (ref 80–100)
Monocytes: 5 %
NRBC/100 WBC: 1 /
Platelet: 227 10*3/uL (ref 150–440)
RBC: 3.18 10*6/uL — AB (ref 3.80–5.20)
RDW: 20.7 % — ABNORMAL HIGH (ref 11.5–14.5)
Segmented Neutrophils: 76 %
WBC: 24.3 10*3/uL — AB (ref 3.6–11.0)

## 2013-10-13 LAB — AMMONIA: Ammonia, Plasma: <10 mcmol/L (ref 11–32)

## 2013-10-14 LAB — CBC WITH DIFFERENTIAL/PLATELET
BASOS ABS: 1 %
Bands: 12 %
COMMENT - H1-COM6: NORMAL
HCT: 31.8 % — ABNORMAL LOW (ref 35.0–47.0)
HGB: 10.3 g/dL — AB (ref 12.0–16.0)
Lymphocytes: 9 %
MCH: 34.9 pg — ABNORMAL HIGH (ref 26.0–34.0)
MCHC: 32.5 g/dL (ref 32.0–36.0)
MCV: 107 fL — AB (ref 80–100)
MONOS PCT: 2 %
PLATELETS: 193 10*3/uL (ref 150–440)
RBC: 2.96 10*6/uL — ABNORMAL LOW (ref 3.80–5.20)
RDW: 20.1 % — AB (ref 11.5–14.5)
SEGMENTED NEUTROPHILS: 76 %
WBC: 19.7 10*3/uL — ABNORMAL HIGH (ref 3.6–11.0)

## 2013-10-14 LAB — COMPREHENSIVE METABOLIC PANEL
ALBUMIN: 2.3 g/dL — AB (ref 3.4–5.0)
ALT: 41 U/L (ref 12–78)
ANION GAP: 5 — AB (ref 7–16)
Alkaline Phosphatase: 294 U/L — ABNORMAL HIGH
BILIRUBIN TOTAL: 9.1 mg/dL — AB (ref 0.2–1.0)
BUN: 8 mg/dL (ref 7–18)
CHLORIDE: 100 mmol/L (ref 98–107)
CREATININE: 0.65 mg/dL (ref 0.60–1.30)
Calcium, Total: 8.4 mg/dL — ABNORMAL LOW (ref 8.5–10.1)
Co2: 29 mmol/L (ref 21–32)
EGFR (Non-African Amer.): >60
Glucose: 183 mg/dL — ABNORMAL HIGH (ref 65–99)
Osmolality: 271 (ref 275–301)
Potassium: 3.6 mmol/L (ref 3.5–5.1)
SGOT(AST): 118 U/L — ABNORMAL HIGH (ref 15–37)
Sodium: 134 mmol/L — ABNORMAL LOW (ref 136–145)
Total Protein: 7 g/dL (ref 6.4–8.2)

## 2013-10-14 LAB — PROTIME-INR
INR: 1.3
Prothrombin Time: 16.3 secs — ABNORMAL HIGH (ref 11.5–14.7)

## 2013-10-14 LAB — URINE CULTURE

## 2013-10-15 LAB — COMPREHENSIVE METABOLIC PANEL
ALBUMIN: 2.3 g/dL — AB (ref 3.4–5.0)
ALK PHOS: 267 U/L — AB
ANION GAP: 4 — AB (ref 7–16)
BILIRUBIN TOTAL: 6.5 mg/dL — AB (ref 0.2–1.0)
BUN: 11 mg/dL (ref 7–18)
CALCIUM: 8.6 mg/dL (ref 8.5–10.1)
Chloride: 101 mmol/L (ref 98–107)
Co2: 30 mmol/L (ref 21–32)
Creatinine: 0.6 mg/dL (ref 0.60–1.30)
EGFR (African American): >60
EGFR (Non-African Amer.): >60
Glucose: 151 mg/dL — ABNORMAL HIGH (ref 65–99)
Osmolality: 272 (ref 275–301)
Potassium: 4.4 mmol/L (ref 3.5–5.1)
SGOT(AST): 143 U/L — ABNORMAL HIGH (ref 15–37)
SGPT (ALT): 49 U/L (ref 12–78)
Sodium: 135 mmol/L — ABNORMAL LOW (ref 136–145)
TOTAL PROTEIN: 6.9 g/dL (ref 6.4–8.2)

## 2013-10-15 LAB — CBC WITH DIFFERENTIAL/PLATELET
BASOS ABS: 0.1 10*3/uL (ref 0.0–0.1)
Basophil %: 0.4 %
EOS ABS: 0 10*3/uL (ref 0.0–0.7)
Eosinophil %: 0 %
HCT: 30.9 % — AB (ref 35.0–47.0)
HGB: 10.3 g/dL — AB (ref 12.0–16.0)
LYMPHS PCT: 15.3 %
Lymphocyte #: 4.3 10*3/uL — ABNORMAL HIGH (ref 1.0–3.6)
MCH: 35.5 pg — ABNORMAL HIGH (ref 26.0–34.0)
MCHC: 33.2 g/dL (ref 32.0–36.0)
MCV: 107 fL — AB (ref 80–100)
Monocyte #: 1.5 x10 3/mm — ABNORMAL HIGH (ref 0.2–0.9)
Monocyte %: 5.2 %
NEUTROS PCT: 79.1 %
Neutrophil #: 22.3 10*3/uL — ABNORMAL HIGH (ref 1.4–6.5)
PLATELETS: 215 10*3/uL (ref 150–440)
RBC: 2.9 10*6/uL — AB (ref 3.80–5.20)
RDW: 20.1 % — ABNORMAL HIGH (ref 11.5–14.5)
WBC: 26.6 10*3/uL — AB (ref 3.6–11.0)

## 2013-10-16 LAB — COMPREHENSIVE METABOLIC PANEL
ALBUMIN: 2.3 g/dL — AB (ref 3.4–5.0)
ALK PHOS: 266 U/L — AB
ANION GAP: 3 — AB (ref 7–16)
BILIRUBIN TOTAL: 5.1 mg/dL — AB (ref 0.2–1.0)
BUN: 15 mg/dL (ref 7–18)
CO2: 29 mmol/L (ref 21–32)
CREATININE: 0.54 mg/dL — AB (ref 0.60–1.30)
Calcium, Total: 8.8 mg/dL (ref 8.5–10.1)
Chloride: 102 mmol/L (ref 98–107)
EGFR (Non-African Amer.): >60
Glucose: 126 mg/dL — ABNORMAL HIGH (ref 65–99)
Osmolality: 271 (ref 275–301)
Potassium: 4.3 mmol/L (ref 3.5–5.1)
SGOT(AST): 172 U/L — ABNORMAL HIGH (ref 15–37)
SGPT (ALT): 61 U/L (ref 12–78)
SODIUM: 134 mmol/L — AB (ref 136–145)
TOTAL PROTEIN: 6.6 g/dL (ref 6.4–8.2)

## 2013-10-17 LAB — CBC WITH DIFFERENTIAL/PLATELET
BASOS ABS: 0.1 10*3/uL (ref 0.0–0.1)
Basophil %: 0.6 %
EOS ABS: 0 10*3/uL (ref 0.0–0.7)
Eosinophil %: 0 %
HCT: 34.5 % — ABNORMAL LOW (ref 35.0–47.0)
HGB: 11.5 g/dL — ABNORMAL LOW (ref 12.0–16.0)
LYMPHS PCT: 8.2 %
Lymphocyte #: 2.1 10*3/uL (ref 1.0–3.6)
MCH: 35.6 pg — ABNORMAL HIGH (ref 26.0–34.0)
MCHC: 33.3 g/dL (ref 32.0–36.0)
MCV: 107 fL — AB (ref 80–100)
MONO ABS: 1.7 x10 3/mm — AB (ref 0.2–0.9)
MONOS PCT: 6.4 %
Neutrophil #: 21.8 10*3/uL — ABNORMAL HIGH (ref 1.4–6.5)
Neutrophil %: 84.8 %
PLATELETS: 277 10*3/uL (ref 150–440)
RBC: 3.23 10*6/uL — ABNORMAL LOW (ref 3.80–5.20)
RDW: 21.8 % — AB (ref 11.5–14.5)
WBC: 25.8 10*3/uL — AB (ref 3.6–11.0)

## 2013-10-17 LAB — COMPREHENSIVE METABOLIC PANEL
ALBUMIN: 2.4 g/dL — AB (ref 3.4–5.0)
ANION GAP: 4 — AB (ref 7–16)
Alkaline Phosphatase: 274 U/L — ABNORMAL HIGH
BUN: 11 mg/dL (ref 7–18)
Bilirubin,Total: 4.6 mg/dL — ABNORMAL HIGH (ref 0.2–1.0)
CREATININE: 0.8 mg/dL (ref 0.60–1.30)
Calcium, Total: 9.2 mg/dL (ref 8.5–10.1)
Chloride: 103 mmol/L (ref 98–107)
Co2: 28 mmol/L (ref 21–32)
EGFR (Non-African Amer.): >60
GLUCOSE: 131 mg/dL — AB (ref 65–99)
OSMOLALITY: 271 (ref 275–301)
Potassium: 3.8 mmol/L (ref 3.5–5.1)
SGOT(AST): 187 U/L — ABNORMAL HIGH (ref 15–37)
SGPT (ALT): 88 U/L — ABNORMAL HIGH (ref 12–78)
SODIUM: 135 mmol/L — AB (ref 136–145)
Total Protein: 7.1 g/dL (ref 6.4–8.2)

## 2013-10-17 LAB — CLOSTRIDIUM DIFFICILE(ARMC)

## 2013-10-17 LAB — CULTURE, BLOOD (SINGLE)

## 2013-10-19 LAB — CBC WITH DIFFERENTIAL/PLATELET
Basophil #: 0.1 10*3/uL (ref 0.0–0.1)
Basophil %: 0.2 %
EOS PCT: 0 %
Eosinophil #: 0 10*3/uL (ref 0.0–0.7)
HCT: 35.2 % (ref 35.0–47.0)
HGB: 11.6 g/dL — AB (ref 12.0–16.0)
Lymphocyte #: 0.8 10*3/uL — ABNORMAL LOW (ref 1.0–3.6)
Lymphocyte %: 3.4 %
MCH: 35.5 pg — AB (ref 26.0–34.0)
MCHC: 32.9 g/dL (ref 32.0–36.0)
MCV: 108 fL — ABNORMAL HIGH (ref 80–100)
Monocyte #: 1.7 x10 3/mm — ABNORMAL HIGH (ref 0.2–0.9)
Monocyte %: 6.9 %
Neutrophil #: 21.8 10*3/uL — ABNORMAL HIGH (ref 1.4–6.5)
Neutrophil %: 89.5 %
Platelet: 268 10*3/uL (ref 150–440)
RBC: 3.27 10*6/uL — ABNORMAL LOW (ref 3.80–5.20)
RDW: 21.2 % — ABNORMAL HIGH (ref 11.5–14.5)
WBC: 24.4 10*3/uL — ABNORMAL HIGH (ref 3.6–11.0)

## 2013-11-06 LAB — CULTURE, BLOOD (SINGLE)

## 2013-11-23 ENCOUNTER — Inpatient Hospital Stay: Payer: Self-pay | Admitting: Internal Medicine

## 2013-11-23 LAB — COMPREHENSIVE METABOLIC PANEL
ALBUMIN: 4.2 g/dL (ref 3.4–5.0)
ALK PHOS: 184 U/L — AB
ALT: 32 U/L (ref 12–78)
ANION GAP: 9 (ref 7–16)
BILIRUBIN TOTAL: 3.4 mg/dL — AB (ref 0.2–1.0)
BUN: 18 mg/dL (ref 7–18)
CHLORIDE: 100 mmol/L (ref 98–107)
CO2: 29 mmol/L (ref 21–32)
Calcium, Total: 9.2 mg/dL (ref 8.5–10.1)
Creatinine: 0.52 mg/dL — ABNORMAL LOW (ref 0.60–1.30)
GLUCOSE: 117 mg/dL — AB (ref 65–99)
OSMOLALITY: 279 (ref 275–301)
Potassium: 3.2 mmol/L — ABNORMAL LOW (ref 3.5–5.1)
SGOT(AST): 88 U/L — ABNORMAL HIGH (ref 15–37)
Sodium: 138 mmol/L (ref 136–145)
Total Protein: 9.2 g/dL — ABNORMAL HIGH (ref 6.4–8.2)

## 2013-11-23 LAB — URINALYSIS, COMPLETE
Blood: NEGATIVE
GLUCOSE, UR: NEGATIVE mg/dL (ref 0–75)
Leukocyte Esterase: NEGATIVE
Nitrite: NEGATIVE
Ph: 6 (ref 4.5–8.0)
Protein: 30
Specific Gravity: 1.026 (ref 1.003–1.030)
WBC UR: 4 /HPF (ref 0–5)

## 2013-11-23 LAB — CBC
HCT: 40.8 % (ref 35.0–47.0)
HGB: 13.6 g/dL (ref 12.0–16.0)
MCH: 34.3 pg — AB (ref 26.0–34.0)
MCHC: 33.5 g/dL (ref 32.0–36.0)
MCV: 103 fL — ABNORMAL HIGH (ref 80–100)
Platelet: 145 10*3/uL — ABNORMAL LOW (ref 150–440)
RBC: 3.98 10*6/uL (ref 3.80–5.20)
RDW: 15.1 % — ABNORMAL HIGH (ref 11.5–14.5)
WBC: 19.7 10*3/uL — AB (ref 3.6–11.0)

## 2013-11-23 LAB — DRUG SCREEN, URINE
Amphetamines, Ur Screen: NEGATIVE (ref ?–1000)
Barbiturates, Ur Screen: NEGATIVE (ref ?–200)
Benzodiazepine, Ur Scrn: NEGATIVE (ref ?–200)
CANNABINOID 50 NG, UR ~~LOC~~: NEGATIVE (ref ?–50)
Cocaine Metabolite,Ur ~~LOC~~: NEGATIVE (ref ?–300)
MDMA (Ecstasy)Ur Screen: NEGATIVE (ref ?–500)
Methadone, Ur Screen: NEGATIVE (ref ?–300)
Opiate, Ur Screen: POSITIVE (ref ?–300)
Phencyclidine (PCP) Ur S: NEGATIVE (ref ?–25)
Tricyclic, Ur Screen: NEGATIVE (ref ?–1000)

## 2013-11-23 LAB — PREGNANCY, URINE: Pregnancy Test, Urine: NEGATIVE m[IU]/mL

## 2013-11-23 LAB — ETHANOL
Ethanol %: <0.003 % (ref 0.000–0.080)
Ethanol: <3 mg/dL

## 2013-11-23 LAB — LIPASE, BLOOD: Lipase: 209 U/L (ref 73–393)

## 2013-11-24 LAB — COMPREHENSIVE METABOLIC PANEL
ALT: 33 U/L (ref 12–78)
ANION GAP: 9 (ref 7–16)
Albumin: 3.2 g/dL — ABNORMAL LOW (ref 3.4–5.0)
Alkaline Phosphatase: 148 U/L — ABNORMAL HIGH
BUN: 13 mg/dL (ref 7–18)
Bilirubin,Total: 3.2 mg/dL — ABNORMAL HIGH (ref 0.2–1.0)
CALCIUM: 7.9 mg/dL — AB (ref 8.5–10.1)
Chloride: 106 mmol/L (ref 98–107)
Co2: 24 mmol/L (ref 21–32)
Creatinine: 0.4 mg/dL — ABNORMAL LOW (ref 0.60–1.30)
EGFR (African American): >60
EGFR (Non-African Amer.): >60
Glucose: 76 mg/dL (ref 65–99)
Osmolality: 276 (ref 275–301)
Potassium: 3.8 mmol/L (ref 3.5–5.1)
SGOT(AST): 84 U/L — ABNORMAL HIGH (ref 15–37)
Sodium: 139 mmol/L (ref 136–145)
Total Protein: 7.8 g/dL (ref 6.4–8.2)

## 2013-11-24 LAB — CBC WITH DIFFERENTIAL/PLATELET
BASOS ABS: 0.1 10*3/uL (ref 0.0–0.1)
BASOS PCT: 0.5 %
EOS ABS: 0.2 10*3/uL (ref 0.0–0.7)
EOS PCT: 1.7 %
HCT: 37.7 % (ref 35.0–47.0)
HGB: 12.2 g/dL (ref 12.0–16.0)
LYMPHS ABS: 2.4 10*3/uL (ref 1.0–3.6)
Lymphocyte %: 19.9 %
MCH: 34 pg (ref 26.0–34.0)
MCHC: 32.5 g/dL (ref 32.0–36.0)
MCV: 105 fL — ABNORMAL HIGH (ref 80–100)
MONO ABS: 1 x10 3/mm — AB (ref 0.2–0.9)
Monocyte %: 8.2 %
Neutrophil #: 8.5 10*3/uL — ABNORMAL HIGH (ref 1.4–6.5)
Neutrophil %: 69.7 %
Platelet: 101 10*3/uL — ABNORMAL LOW (ref 150–440)
RBC: 3.6 10*6/uL — AB (ref 3.80–5.20)
RDW: 15 % — ABNORMAL HIGH (ref 11.5–14.5)
WBC: 12.1 10*3/uL — ABNORMAL HIGH (ref 3.6–11.0)

## 2013-11-24 LAB — MAGNESIUM: Magnesium: 1.5 mg/dL — ABNORMAL LOW

## 2013-11-25 LAB — CBC WITH DIFFERENTIAL/PLATELET
BASOS PCT: 0.3 %
Basophil #: 0 10*3/uL (ref 0.0–0.1)
Eosinophil #: 0.1 10*3/uL (ref 0.0–0.7)
Eosinophil %: 1.1 %
HCT: 35.9 % (ref 35.0–47.0)
HGB: 11.4 g/dL — ABNORMAL LOW (ref 12.0–16.0)
Lymphocyte #: 1.7 10*3/uL (ref 1.0–3.6)
Lymphocyte %: 16.9 %
MCH: 34.1 pg — AB (ref 26.0–34.0)
MCHC: 31.8 g/dL — AB (ref 32.0–36.0)
MCV: 107 fL — ABNORMAL HIGH (ref 80–100)
MONO ABS: 0.6 x10 3/mm (ref 0.2–0.9)
Monocyte %: 5.9 %
Neutrophil #: 7.6 10*3/uL — ABNORMAL HIGH (ref 1.4–6.5)
Neutrophil %: 75.8 %
Platelet: 82 10*3/uL — ABNORMAL LOW (ref 150–440)
RBC: 3.36 10*6/uL — AB (ref 3.80–5.20)
RDW: 15.3 % — ABNORMAL HIGH (ref 11.5–14.5)
WBC: 10.1 10*3/uL (ref 3.6–11.0)

## 2013-11-25 LAB — COMPREHENSIVE METABOLIC PANEL
ALBUMIN: 3 g/dL — AB (ref 3.4–5.0)
ALK PHOS: 126 U/L — AB
AST: 84 U/L — AB (ref 15–37)
Anion Gap: 6 — ABNORMAL LOW (ref 7–16)
BUN: 5 mg/dL — ABNORMAL LOW (ref 7–18)
Bilirubin,Total: 2.1 mg/dL — ABNORMAL HIGH (ref 0.2–1.0)
CALCIUM: 7.6 mg/dL — AB (ref 8.5–10.1)
Chloride: 113 mmol/L — ABNORMAL HIGH (ref 98–107)
Co2: 19 mmol/L — ABNORMAL LOW (ref 21–32)
Creatinine: 0.38 mg/dL — ABNORMAL LOW (ref 0.60–1.30)
Glucose: 107 mg/dL — ABNORMAL HIGH (ref 65–99)
OSMOLALITY: 273 (ref 275–301)
Potassium: 4.4 mmol/L (ref 3.5–5.1)
SGPT (ALT): 34 U/L (ref 12–78)
Sodium: 138 mmol/L (ref 136–145)
TOTAL PROTEIN: 7 g/dL (ref 6.4–8.2)

## 2013-11-25 LAB — MAGNESIUM: MAGNESIUM: 2.2 mg/dL

## 2013-11-26 LAB — CBC WITH DIFFERENTIAL/PLATELET
BASOS ABS: 0.1 10*3/uL (ref 0.0–0.1)
Basophil %: 1 %
EOS PCT: 0.9 %
Eosinophil #: 0.1 10*3/uL (ref 0.0–0.7)
HCT: 31.9 % — ABNORMAL LOW (ref 35.0–47.0)
HGB: 10.5 g/dL — ABNORMAL LOW (ref 12.0–16.0)
LYMPHS ABS: 2 10*3/uL (ref 1.0–3.6)
Lymphocyte %: 16.6 %
MCH: 34.9 pg — AB (ref 26.0–34.0)
MCHC: 33 g/dL (ref 32.0–36.0)
MCV: 106 fL — ABNORMAL HIGH (ref 80–100)
MONO ABS: 0.9 x10 3/mm (ref 0.2–0.9)
Monocyte %: 7.7 %
NEUTROS PCT: 73.8 %
Neutrophil #: 8.9 10*3/uL — ABNORMAL HIGH (ref 1.4–6.5)
Platelet: 68 10*3/uL — ABNORMAL LOW (ref 150–440)
RBC: 3.02 10*6/uL — ABNORMAL LOW (ref 3.80–5.20)
RDW: 15.6 % — ABNORMAL HIGH (ref 11.5–14.5)
WBC: 12 10*3/uL — AB (ref 3.6–11.0)

## 2013-11-26 LAB — MAGNESIUM: Magnesium: 1.8 mg/dL

## 2013-11-27 LAB — COMPREHENSIVE METABOLIC PANEL
ALBUMIN: 2.6 g/dL — AB (ref 3.4–5.0)
ALK PHOS: 115 U/L
AST: 42 U/L — AB (ref 15–37)
Anion Gap: 6 — ABNORMAL LOW (ref 7–16)
BUN: 2 mg/dL — ABNORMAL LOW (ref 7–18)
Bilirubin,Total: 1.4 mg/dL — ABNORMAL HIGH (ref 0.2–1.0)
CALCIUM: 8.4 mg/dL — AB (ref 8.5–10.1)
Chloride: 113 mmol/L — ABNORMAL HIGH (ref 98–107)
Co2: 20 mmol/L — ABNORMAL LOW (ref 21–32)
Creatinine: 0.36 mg/dL — ABNORMAL LOW (ref 0.60–1.30)
EGFR (African American): >60
GLUCOSE: 106 mg/dL — AB (ref 65–99)
OSMOLALITY: 274 (ref 275–301)
Potassium: 4.4 mmol/L (ref 3.5–5.1)
SGPT (ALT): 22 U/L (ref 12–78)
Sodium: 139 mmol/L (ref 136–145)
TOTAL PROTEIN: 6.1 g/dL — AB (ref 6.4–8.2)

## 2013-11-28 LAB — CBC WITH DIFFERENTIAL/PLATELET
BASOS ABS: 0.1 10*3/uL (ref 0.0–0.1)
BASOS PCT: 0.9 %
EOS PCT: 1.7 %
Eosinophil #: 0.2 10*3/uL (ref 0.0–0.7)
HCT: 38.1 % (ref 35.0–47.0)
HGB: 12.2 g/dL (ref 12.0–16.0)
LYMPHS PCT: 26.1 %
Lymphocyte #: 2.3 10*3/uL (ref 1.0–3.6)
MCH: 34 pg (ref 26.0–34.0)
MCHC: 32 g/dL (ref 32.0–36.0)
MCV: 106 fL — AB (ref 80–100)
MONO ABS: 1.2 x10 3/mm — AB (ref 0.2–0.9)
Monocyte %: 13.7 %
Neutrophil #: 5.1 10*3/uL (ref 1.4–6.5)
Neutrophil %: 57.6 %
Platelet: 101 10*3/uL — ABNORMAL LOW (ref 150–440)
RBC: 3.58 10*6/uL — ABNORMAL LOW (ref 3.80–5.20)
RDW: 15.7 % — ABNORMAL HIGH (ref 11.5–14.5)
WBC: 8.9 10*3/uL (ref 3.6–11.0)

## 2013-11-28 LAB — AMMONIA: AMMONIA, PLASMA: 61 umol/L — AB (ref 11–32)

## 2013-11-28 LAB — COMPREHENSIVE METABOLIC PANEL
ALK PHOS: 127 U/L — AB
AST: 41 U/L — AB (ref 15–37)
Albumin: 3 g/dL — ABNORMAL LOW (ref 3.4–5.0)
Anion Gap: 6 — ABNORMAL LOW (ref 7–16)
BILIRUBIN TOTAL: 1.5 mg/dL — AB (ref 0.2–1.0)
BUN: 3 mg/dL — ABNORMAL LOW (ref 7–18)
CALCIUM: 9.1 mg/dL (ref 8.5–10.1)
CO2: 25 mmol/L (ref 21–32)
Chloride: 109 mmol/L — ABNORMAL HIGH (ref 98–107)
Creatinine: 0.46 mg/dL — ABNORMAL LOW (ref 0.60–1.30)
EGFR (African American): >60
GLUCOSE: 100 mg/dL — AB (ref 65–99)
Osmolality: 276 (ref 275–301)
Potassium: 4.2 mmol/L (ref 3.5–5.1)
SGPT (ALT): 24 U/L (ref 12–78)
Sodium: 140 mmol/L (ref 136–145)
TOTAL PROTEIN: 6.8 g/dL (ref 6.4–8.2)

## 2013-11-28 LAB — CULTURE, BLOOD (SINGLE)

## 2013-11-29 LAB — AMMONIA: Ammonia, Plasma: 54 mcmol/L — ABNORMAL HIGH (ref 11–32)

## 2013-11-30 LAB — AMMONIA: Ammonia, Plasma: 49 mcmol/L — ABNORMAL HIGH (ref 11–32)

## 2013-12-01 LAB — AMMONIA: Ammonia, Plasma: 57 mcmol/L — ABNORMAL HIGH (ref 11–32)

## 2013-12-02 LAB — BASIC METABOLIC PANEL
ANION GAP: 6 — AB (ref 7–16)
BUN: 6 mg/dL — ABNORMAL LOW (ref 7–18)
CHLORIDE: 104 mmol/L (ref 98–107)
Calcium, Total: 8.9 mg/dL (ref 8.5–10.1)
Co2: 27 mmol/L (ref 21–32)
Creatinine: 0.8 mg/dL (ref 0.60–1.30)
EGFR (African American): >60
EGFR (Non-African Amer.): >60
GLUCOSE: 100 mg/dL — AB (ref 65–99)
Osmolality: 272 (ref 275–301)
Potassium: 3.7 mmol/L (ref 3.5–5.1)
SODIUM: 137 mmol/L (ref 136–145)

## 2013-12-02 LAB — CBC WITH DIFFERENTIAL/PLATELET
BASOS PCT: 0.6 %
Basophil #: 0.1 10*3/uL (ref 0.0–0.1)
EOS ABS: 0.2 10*3/uL (ref 0.0–0.7)
Eosinophil %: 1.7 %
HCT: 35.4 % (ref 35.0–47.0)
HGB: 11.5 g/dL — ABNORMAL LOW (ref 12.0–16.0)
LYMPHS ABS: 2.6 10*3/uL (ref 1.0–3.6)
Lymphocyte %: 27.5 %
MCH: 33.3 pg (ref 26.0–34.0)
MCHC: 32.4 g/dL (ref 32.0–36.0)
MCV: 103 fL — AB (ref 80–100)
MONO ABS: 1.9 x10 3/mm — AB (ref 0.2–0.9)
MONOS PCT: 19.8 %
Neutrophil #: 4.8 10*3/uL (ref 1.4–6.5)
Neutrophil %: 50.4 %
Platelet: 149 10*3/uL — ABNORMAL LOW (ref 150–440)
RBC: 3.45 10*6/uL — ABNORMAL LOW (ref 3.80–5.20)
RDW: 15.5 % — AB (ref 11.5–14.5)
WBC: 9.5 10*3/uL (ref 3.6–11.0)

## 2013-12-02 LAB — AMMONIA: AMMONIA, PLASMA: 55 umol/L — AB (ref 11–32)

## 2014-03-13 ENCOUNTER — Emergency Department: Payer: Self-pay | Admitting: Emergency Medicine

## 2014-03-29 ENCOUNTER — Ambulatory Visit: Payer: Self-pay | Admitting: Pain Medicine

## 2014-08-15 ENCOUNTER — Inpatient Hospital Stay: Payer: Self-pay | Admitting: Internal Medicine

## 2014-08-15 LAB — COMPREHENSIVE METABOLIC PANEL
ALK PHOS: 228 U/L — AB
ANION GAP: 15 (ref 7–16)
Albumin: 3.3 g/dL — ABNORMAL LOW (ref 3.4–5.0)
BUN: 14 mg/dL (ref 7–18)
Bilirubin,Total: 5.2 mg/dL — ABNORMAL HIGH (ref 0.2–1.0)
CHLORIDE: 104 mmol/L (ref 98–107)
Calcium, Total: 8.6 mg/dL (ref 8.5–10.1)
Co2: 25 mmol/L (ref 21–32)
Creatinine: 0.64 mg/dL (ref 0.60–1.30)
EGFR (Non-African Amer.): >60
Glucose: 66 mg/dL (ref 65–99)
OSMOLALITY: 286 (ref 275–301)
Potassium: 3.5 mmol/L (ref 3.5–5.1)
SGOT(AST): 229 U/L — ABNORMAL HIGH (ref 15–37)
SGPT (ALT): 69 U/L — ABNORMAL HIGH
SODIUM: 144 mmol/L (ref 136–145)
Total Protein: 7.6 g/dL (ref 6.4–8.2)

## 2014-08-15 LAB — URINALYSIS, COMPLETE
Bacteria: NONE SEEN
RBC,UR: 3 /HPF (ref 0–5)
SPECIFIC GRAVITY: 1.029 (ref 1.003–1.030)
Squamous Epithelial: 10
WBC UR: 27 /HPF (ref 0–5)

## 2014-08-15 LAB — CBC WITH DIFFERENTIAL/PLATELET
BASOS PCT: 0.2 %
Basophil #: 0 10*3/uL (ref 0.0–0.1)
EOS ABS: 0.1 10*3/uL (ref 0.0–0.7)
Eosinophil %: 0.7 %
HCT: 43.3 % (ref 35.0–47.0)
HGB: 14.4 g/dL (ref 12.0–16.0)
LYMPHS PCT: 27.6 %
Lymphocyte #: 3.4 10*3/uL (ref 1.0–3.6)
MCH: 33.7 pg (ref 26.0–34.0)
MCHC: 33.1 g/dL (ref 32.0–36.0)
MCV: 102 fL — ABNORMAL HIGH (ref 80–100)
MONO ABS: 0.9 x10 3/mm (ref 0.2–0.9)
MONOS PCT: 7.3 %
NEUTROS PCT: 64.2 %
Neutrophil #: 7.9 10*3/uL — ABNORMAL HIGH (ref 1.4–6.5)
PLATELETS: 110 10*3/uL — AB (ref 150–440)
RBC: 4.26 10*6/uL (ref 3.80–5.20)
RDW: 16.7 % — ABNORMAL HIGH (ref 11.5–14.5)
WBC: 12.3 10*3/uL — AB (ref 3.6–11.0)

## 2014-08-15 LAB — AMMONIA: Ammonia, Plasma: 33 mcmol/L — ABNORMAL HIGH (ref 11–32)

## 2014-08-15 LAB — ETHANOL: Ethanol: 265 mg/dL

## 2014-08-15 LAB — LIPASE, BLOOD: Lipase: 171 U/L (ref 73–393)

## 2014-08-16 LAB — CBC WITH DIFFERENTIAL/PLATELET
BASOS PCT: 0.7 %
Basophil #: 0 10*3/uL (ref 0.0–0.1)
EOS PCT: 1.1 %
Eosinophil #: 0.1 10*3/uL (ref 0.0–0.7)
HCT: 37.8 % (ref 35.0–47.0)
HGB: 12.4 g/dL (ref 12.0–16.0)
LYMPHS PCT: 19.9 %
Lymphocyte #: 1.1 10*3/uL (ref 1.0–3.6)
MCH: 34.1 pg — ABNORMAL HIGH (ref 26.0–34.0)
MCHC: 32.7 g/dL (ref 32.0–36.0)
MCV: 104 fL — ABNORMAL HIGH (ref 80–100)
MONO ABS: 0.5 x10 3/mm (ref 0.2–0.9)
Monocyte %: 8.4 %
NEUTROS PCT: 69.9 %
Neutrophil #: 3.9 10*3/uL (ref 1.4–6.5)
Platelet: 64 10*3/uL — ABNORMAL LOW (ref 150–440)
RBC: 3.63 10*6/uL — AB (ref 3.80–5.20)
RDW: 16.5 % — ABNORMAL HIGH (ref 11.5–14.5)
WBC: 5.5 10*3/uL (ref 3.6–11.0)

## 2014-08-16 LAB — COMPREHENSIVE METABOLIC PANEL
ALK PHOS: 188 U/L — AB
ALT: 65 U/L — AB
ANION GAP: 11 (ref 7–16)
AST: 245 U/L — AB (ref 15–37)
Albumin: 2.8 g/dL — ABNORMAL LOW (ref 3.4–5.0)
BILIRUBIN TOTAL: 5.6 mg/dL — AB (ref 0.2–1.0)
BUN: 11 mg/dL (ref 7–18)
CHLORIDE: 106 mmol/L (ref 98–107)
CO2: 24 mmol/L (ref 21–32)
Calcium, Total: 7.3 mg/dL — ABNORMAL LOW (ref 8.5–10.1)
Creatinine: 0.88 mg/dL (ref 0.60–1.30)
EGFR (African American): >60
EGFR (Non-African Amer.): >60
Glucose: 110 mg/dL — ABNORMAL HIGH (ref 65–99)
Osmolality: 281 (ref 275–301)
Potassium: 3.1 mmol/L — ABNORMAL LOW (ref 3.5–5.1)
Sodium: 141 mmol/L (ref 136–145)
Total Protein: 6.5 g/dL (ref 6.4–8.2)

## 2014-08-16 LAB — TSH: Thyroid Stimulating Horm: 3.91 u[IU]/mL

## 2014-08-16 LAB — AMMONIA: Ammonia, Plasma: 79 mcmol/L — ABNORMAL HIGH (ref 11–32)

## 2014-08-17 LAB — CBC WITH DIFFERENTIAL/PLATELET
Basophil #: 0 10*3/uL (ref 0.0–0.1)
Basophil %: 0.2 %
EOS PCT: 1.9 %
Eosinophil #: 0.2 10*3/uL (ref 0.0–0.7)
HCT: 39.3 % (ref 35.0–47.0)
HGB: 13 g/dL (ref 12.0–16.0)
Lymphocyte #: 1.3 10*3/uL (ref 1.0–3.6)
Lymphocyte %: 14.8 %
MCH: 34.6 pg — ABNORMAL HIGH (ref 26.0–34.0)
MCHC: 33.1 g/dL (ref 32.0–36.0)
MCV: 105 fL — ABNORMAL HIGH (ref 80–100)
MONOS PCT: 4.8 %
Monocyte #: 0.4 x10 3/mm (ref 0.2–0.9)
NEUTROS PCT: 78.3 %
Neutrophil #: 6.8 10*3/uL — ABNORMAL HIGH (ref 1.4–6.5)
PLATELETS: 51 10*3/uL — AB (ref 150–440)
RBC: 3.75 10*6/uL — ABNORMAL LOW (ref 3.80–5.20)
RDW: 16.3 % — ABNORMAL HIGH (ref 11.5–14.5)
WBC: 8.7 10*3/uL (ref 3.6–11.0)

## 2014-08-17 LAB — BASIC METABOLIC PANEL
ANION GAP: 7 (ref 7–16)
BUN: 5 mg/dL — AB (ref 7–18)
CHLORIDE: 112 mmol/L — AB (ref 98–107)
CREATININE: 0.59 mg/dL — AB (ref 0.60–1.30)
Calcium, Total: 7.5 mg/dL — ABNORMAL LOW (ref 8.5–10.1)
Co2: 23 mmol/L (ref 21–32)
EGFR (African American): >60
EGFR (Non-African Amer.): >60
Glucose: 95 mg/dL (ref 65–99)
Osmolality: 280 (ref 275–301)
Potassium: 3.9 mmol/L (ref 3.5–5.1)
Sodium: 142 mmol/L (ref 136–145)

## 2014-08-17 LAB — PROTIME-INR
INR: 1.7
Prothrombin Time: 19.5 secs — ABNORMAL HIGH (ref 11.5–14.7)

## 2014-08-18 LAB — CBC WITH DIFFERENTIAL/PLATELET
Basophil #: 0 10*3/uL (ref 0.0–0.1)
Basophil %: 0.2 %
EOS ABS: 0.1 10*3/uL (ref 0.0–0.7)
EOS PCT: 0.7 %
HCT: 37.8 % (ref 35.0–47.0)
HGB: 12.4 g/dL (ref 12.0–16.0)
LYMPHS PCT: 10.1 %
Lymphocyte #: 1.4 10*3/uL (ref 1.0–3.6)
MCH: 35 pg — AB (ref 26.0–34.0)
MCHC: 32.9 g/dL (ref 32.0–36.0)
MCV: 106 fL — ABNORMAL HIGH (ref 80–100)
Monocyte #: 1.1 x10 3/mm — ABNORMAL HIGH (ref 0.2–0.9)
Monocyte %: 7.8 %
NEUTROS ABS: 11.4 10*3/uL — AB (ref 1.4–6.5)
NEUTROS PCT: 81.2 %
Platelet: 50 10*3/uL — ABNORMAL LOW (ref 150–440)
RBC: 3.55 10*6/uL — AB (ref 3.80–5.20)
RDW: 15.9 % — ABNORMAL HIGH (ref 11.5–14.5)
WBC: 14.1 10*3/uL — ABNORMAL HIGH (ref 3.6–11.0)

## 2014-08-18 LAB — URINALYSIS, COMPLETE
Blood: NEGATIVE
Glucose,UR: NEGATIVE mg/dL (ref 0–75)
LEUKOCYTE ESTERASE: NEGATIVE
NITRITE: NEGATIVE
Ph: 5 (ref 4.5–8.0)
Protein: NEGATIVE
RBC,UR: 1 /HPF (ref 0–5)
Specific Gravity: 1.018 (ref 1.003–1.030)
Squamous Epithelial: 15

## 2014-08-18 LAB — COMPREHENSIVE METABOLIC PANEL
ALK PHOS: 172 U/L — AB
Albumin: 2.4 g/dL — ABNORMAL LOW (ref 3.4–5.0)
Anion Gap: 9 (ref 7–16)
BUN: 4 mg/dL — ABNORMAL LOW (ref 7–18)
Bilirubin,Total: 6.2 mg/dL — ABNORMAL HIGH (ref 0.2–1.0)
CALCIUM: 7.7 mg/dL — AB (ref 8.5–10.1)
CHLORIDE: 109 mmol/L — AB (ref 98–107)
Co2: 24 mmol/L (ref 21–32)
Creatinine: 0.61 mg/dL (ref 0.60–1.30)
EGFR (African American): >60
Glucose: 70 mg/dL (ref 65–99)
Osmolality: 278 (ref 275–301)
Potassium: 3.6 mmol/L (ref 3.5–5.1)
SGOT(AST): 207 U/L — ABNORMAL HIGH (ref 15–37)
SGPT (ALT): 75 U/L — ABNORMAL HIGH
SODIUM: 142 mmol/L (ref 136–145)
Total Protein: 6 g/dL — ABNORMAL LOW (ref 6.4–8.2)

## 2014-08-19 LAB — CBC WITH DIFFERENTIAL/PLATELET
Basophil #: 0 10*3/uL (ref 0.0–0.1)
Basophil %: 0.2 %
EOS PCT: 1.3 %
Eosinophil #: 0.1 10*3/uL (ref 0.0–0.7)
HCT: 35.3 % (ref 35.0–47.0)
HGB: 11.6 g/dL — ABNORMAL LOW (ref 12.0–16.0)
Lymphocyte #: 1.9 10*3/uL (ref 1.0–3.6)
Lymphocyte %: 20.1 %
MCH: 34.6 pg — AB (ref 26.0–34.0)
MCHC: 32.8 g/dL (ref 32.0–36.0)
MCV: 106 fL — ABNORMAL HIGH (ref 80–100)
MONOS PCT: 11.8 %
Monocyte #: 1.1 x10 3/mm — ABNORMAL HIGH (ref 0.2–0.9)
Neutrophil #: 6.2 10*3/uL (ref 1.4–6.5)
Neutrophil %: 66.6 %
PLATELETS: 53 10*3/uL — AB (ref 150–440)
RBC: 3.35 10*6/uL — ABNORMAL LOW (ref 3.80–5.20)
RDW: 16.1 % — ABNORMAL HIGH (ref 11.5–14.5)
WBC: 9.3 10*3/uL (ref 3.6–11.0)

## 2014-08-19 LAB — BASIC METABOLIC PANEL
Anion Gap: 7 (ref 7–16)
BUN: 7 mg/dL (ref 7–18)
CO2: 25 mmol/L (ref 21–32)
Calcium, Total: 7.8 mg/dL — ABNORMAL LOW (ref 8.5–10.1)
Chloride: 106 mmol/L (ref 98–107)
Creatinine: 0.63 mg/dL (ref 0.60–1.30)
EGFR (African American): >60
GLUCOSE: 94 mg/dL (ref 65–99)
OSMOLALITY: 273 (ref 275–301)
Potassium: 3.4 mmol/L — ABNORMAL LOW (ref 3.5–5.1)
SODIUM: 138 mmol/L (ref 136–145)

## 2014-08-21 LAB — URINE CULTURE

## 2014-08-30 ENCOUNTER — Emergency Department: Payer: Self-pay | Admitting: Emergency Medicine

## 2014-08-30 LAB — LIPASE, BLOOD: LIPASE: 162 U/L (ref 73–393)

## 2014-08-30 LAB — HEPATIC FUNCTION PANEL A (ARMC)
Albumin: 2.8 g/dL — ABNORMAL LOW (ref 3.4–5.0)
Alkaline Phosphatase: 164 U/L — ABNORMAL HIGH (ref 46–116)
BILIRUBIN DIRECT: 0.9 mg/dL — AB (ref 0.0–0.2)
BILIRUBIN TOTAL: 1.7 mg/dL — AB (ref 0.2–1.0)
SGOT(AST): 81 U/L — ABNORMAL HIGH (ref 15–37)
SGPT (ALT): 45 U/L (ref 14–63)
Total Protein: 7.1 g/dL (ref 6.4–8.2)

## 2014-08-30 LAB — BASIC METABOLIC PANEL
Anion Gap: 7 (ref 7–16)
BUN: 4 mg/dL — AB (ref 7–18)
CREATININE: 0.8 mg/dL (ref 0.60–1.30)
Calcium, Total: 9 mg/dL (ref 8.5–10.1)
Chloride: 106 mmol/L (ref 98–107)
Co2: 27 mmol/L (ref 21–32)
EGFR (African American): >60
Glucose: 90 mg/dL (ref 65–99)
Osmolality: 276 (ref 275–301)
Potassium: 4 mmol/L (ref 3.5–5.1)
Sodium: 140 mmol/L (ref 136–145)

## 2014-08-30 LAB — APTT: Activated PTT: 32.8 secs (ref 23.6–35.9)

## 2014-08-30 LAB — CBC WITH DIFFERENTIAL/PLATELET
Basophil #: 0.1 10*3/uL (ref 0.0–0.1)
Basophil %: 1.3 %
EOS ABS: 0.1 10*3/uL (ref 0.0–0.7)
EOS PCT: 1.7 %
HCT: 40.3 % (ref 35.0–47.0)
HGB: 13.3 g/dL (ref 12.0–16.0)
LYMPHS ABS: 2.1 10*3/uL (ref 1.0–3.6)
Lymphocyte %: 25.6 %
MCH: 34.3 pg — ABNORMAL HIGH (ref 26.0–34.0)
MCHC: 32.9 g/dL (ref 32.0–36.0)
MCV: 104 fL — AB (ref 80–100)
MONO ABS: 0.7 x10 3/mm (ref 0.2–0.9)
Monocyte %: 8.1 %
Neutrophil #: 5.3 10*3/uL (ref 1.4–6.5)
Neutrophil %: 63.3 %
Platelet: 170 10*3/uL (ref 150–440)
RBC: 3.87 10*6/uL (ref 3.80–5.20)
RDW: 15 % — AB (ref 11.5–14.5)
WBC: 8.3 10*3/uL (ref 3.6–11.0)

## 2014-08-30 LAB — PROTIME-INR
INR: 1.2
Prothrombin Time: 15.7 secs — ABNORMAL HIGH

## 2014-10-22 ENCOUNTER — Emergency Department: Payer: Self-pay | Admitting: Emergency Medicine

## 2014-10-22 LAB — COMPREHENSIVE METABOLIC PANEL
ALBUMIN: 3.4 g/dL — AB
ALT: 57 U/L — AB
Alkaline Phosphatase: 333 U/L — ABNORMAL HIGH
Anion Gap: 8 (ref 7–16)
BILIRUBIN TOTAL: 2.4 mg/dL — AB
CO2: 28 mmol/L
CREATININE: 0.48 mg/dL
Calcium, Total: 8.3 mg/dL — ABNORMAL LOW
Chloride: 102 mmol/L
EGFR (African American): >60
EGFR (Non-African Amer.): >60
GLUCOSE: 149 mg/dL — AB
Potassium: 3.4 mmol/L — ABNORMAL LOW
SGOT(AST): 266 U/L — ABNORMAL HIGH
Sodium: 138 mmol/L
TOTAL PROTEIN: 7.6 g/dL

## 2014-10-22 LAB — URINALYSIS, COMPLETE
BILIRUBIN, UR: NEGATIVE
BLOOD: NEGATIVE
GLUCOSE, UR: NEGATIVE mg/dL (ref 0–75)
KETONE: NEGATIVE
NITRITE: POSITIVE
PROTEIN: NEGATIVE
Ph: 7 (ref 4.5–8.0)
RBC,UR: <1 /HPF (ref 0–5)
Specific Gravity: 1.004 (ref 1.003–1.030)
Squamous Epithelial: 1
WBC UR: 5 /HPF (ref 0–5)

## 2014-10-22 LAB — CBC
HCT: 45.2 % (ref 35.0–47.0)
HGB: 15.1 g/dL (ref 12.0–16.0)
MCH: 34 pg (ref 26.0–34.0)
MCHC: 33.4 g/dL (ref 32.0–36.0)
MCV: 102 fL — AB (ref 80–100)
Platelet: 85 10*3/uL — ABNORMAL LOW (ref 150–440)
RBC: 4.44 10*6/uL (ref 3.80–5.20)
RDW: 15.7 % — AB (ref 11.5–14.5)
WBC: 10.8 10*3/uL (ref 3.6–11.0)

## 2014-10-22 LAB — DRUG SCREEN, URINE
AMPHETAMINES, UR SCREEN: NEGATIVE
BENZODIAZEPINE, UR SCRN: NEGATIVE
Barbiturates, Ur Screen: NEGATIVE
Cannabinoid 50 Ng, Ur ~~LOC~~: NEGATIVE
Cocaine Metabolite,Ur ~~LOC~~: NEGATIVE
MDMA (Ecstasy)Ur Screen: NEGATIVE
Methadone, Ur Screen: NEGATIVE
Opiate, Ur Screen: NEGATIVE
Phencyclidine (PCP) Ur S: NEGATIVE
TRICYCLIC, UR SCREEN: NEGATIVE

## 2014-10-22 LAB — ETHANOL: ETHANOL LVL: 378 mg/dL — AB

## 2014-10-22 LAB — SALICYLATE LEVEL: Salicylates, Serum: <4 mg/dL

## 2014-10-22 LAB — ACETAMINOPHEN LEVEL

## 2014-10-24 LAB — URINE CULTURE

## 2014-10-27 ENCOUNTER — Inpatient Hospital Stay
Admit: 2014-10-27 | Discharge status: Against Medical Advice | Payer: Self-pay | Attending: Internal Medicine | Admitting: Internal Medicine

## 2014-10-27 LAB — URINALYSIS, COMPLETE
BILIRUBIN, UR: NEGATIVE
Bacteria: NONE SEEN
Blood: NEGATIVE
Glucose,UR: NEGATIVE mg/dL (ref 0–75)
Ketone: NEGATIVE
LEUKOCYTE ESTERASE: NEGATIVE
Nitrite: NEGATIVE
Ph: 6 (ref 4.5–8.0)
Protein: NEGATIVE
RBC, UR: NONE SEEN /HPF (ref 0–5)
Specific Gravity: 1.006 (ref 1.003–1.030)
Squamous Epithelial: 6

## 2014-10-27 LAB — BASIC METABOLIC PANEL
ANION GAP: 7 (ref 7–16)
BUN: <5 mg/dL
CHLORIDE: 107 mmol/L
Calcium, Total: 8.4 mg/dL — ABNORMAL LOW
Co2: 28 mmol/L
Creatinine: 0.45 mg/dL
EGFR (African American): >60
Glucose: 83 mg/dL
Potassium: 3.4 mmol/L — ABNORMAL LOW
Sodium: 142 mmol/L

## 2014-10-27 LAB — DRUG SCREEN, URINE
Amphetamines, Ur Screen: NEGATIVE
BARBITURATES, UR SCREEN: NEGATIVE
Benzodiazepine, Ur Scrn: NEGATIVE
CANNABINOID 50 NG, UR ~~LOC~~: NEGATIVE
Cocaine Metabolite,Ur ~~LOC~~: NEGATIVE
MDMA (Ecstasy)Ur Screen: NEGATIVE
Methadone, Ur Screen: NEGATIVE
Opiate, Ur Screen: NEGATIVE
Phencyclidine (PCP) Ur S: NEGATIVE
TRICYCLIC, UR SCREEN: NEGATIVE

## 2014-10-27 LAB — HEPATIC FUNCTION PANEL A (ARMC)
ALK PHOS: 281 U/L — AB
Albumin: 3 g/dL — ABNORMAL LOW
BILIRUBIN DIRECT: 1.3 mg/dL — AB
BILIRUBIN INDIRECT: 1.2 — AB
BILIRUBIN TOTAL: 2.5 mg/dL — AB
SGOT(AST): 183 U/L — ABNORMAL HIGH
SGPT (ALT): 47 U/L
Total Protein: 6.9 g/dL

## 2014-10-27 LAB — CBC
HCT: 41.1 % (ref 35.0–47.0)
HGB: 13.5 g/dL (ref 12.0–16.0)
MCH: 34.1 pg — ABNORMAL HIGH (ref 26.0–34.0)
MCHC: 32.9 g/dL (ref 32.0–36.0)
MCV: 104 fL — AB (ref 80–100)
Platelet: 56 10*3/uL — ABNORMAL LOW (ref 150–440)
RBC: 3.97 10*6/uL (ref 3.80–5.20)
RDW: 15.5 % — AB (ref 11.5–14.5)
WBC: 5.9 10*3/uL (ref 3.6–11.0)

## 2014-10-27 LAB — SALICYLATE LEVEL

## 2014-10-27 LAB — AMMONIA: Ammonia, Plasma: 73 umol/L — ABNORMAL HIGH

## 2014-10-27 LAB — ACETAMINOPHEN LEVEL: Acetaminophen: <10 ug/mL

## 2014-10-27 LAB — LIPASE, BLOOD: LIPASE: 33 U/L

## 2014-10-27 LAB — TSH: Thyroid Stimulating Horm: 3.312 u[IU]/mL

## 2014-10-27 LAB — TROPONIN I: Troponin-I: <0.03 ng/mL

## 2014-10-27 LAB — T4, FREE: Free Thyroxine: 0.87 ng/dL

## 2014-10-27 LAB — ETHANOL: ETHANOL LVL: 247 mg/dL

## 2014-11-15 NOTE — Consult Note (Signed)
PATIENT NAME:  Crystal [name], Crystal [name] MR#:  [id number] DATE OF BIRTH:  Apr 26, 1971  DATE OF CONSULTATION:  03/28/2012  REFERRING PHYSICIAN:   CONSULTING PHYSICIAN:  Sarra Rachels K. Micaylah Bertucci, MD  HISTORY: he patient is seen in consultation in CCU-17. According to the information obtained, the patient overdosed on medications and she overdosed on tricyclics and the bottle was found with her. According to information obtained from the chart and from the RN on the floor, the patient has a long history of drug abuse and she abuses any drugs that she can put her hands on.  Her mother came to visit and RN found out details from her. Mother reports the patient was just out of jail and probably on probation for drug possession and drug charges. In addition, the patient calls the patient a cutter. She has been married but divorced and has a 42 year old son who is out of state. In addition, she has long history of alcohol drinking and when she is taken out of her drowsiness she was asking for vodka and orange juice from the staff in the CCU. The patient is a 44 year old white female, not employed and has been on disability for ADHD and bipolar disorder and aneurysm which was fixed. The patient is divorced for a year after being married for some time and currently lives with her mother.  The [facility] with tricyclic overdose.  When the patient was asked the reason why she overdosed, she reported "nobody likes me".  HISTORY OF PRESENT ILLNESS: When the patient was asked when she last felt well, she reported some time ago and then she said "I don't know". She was still drowsy when she answered these questions. She reports that she knows that she overdosed on pills, but she did not mean to hurt herself.    PAST PSYCHIATRIC HISTORY: The patient reports that she was an inpatient on many occasions and mother reports that she had several inpatient hospitalizations for suicide attempts and being a cutter. She is not being followed  by any psychiatrist on an outpatient basis.    ALCOHOL AND DRUGS: Admits she drinks alcohol on a regular basis at the rate of one-fifth of hard liquor per day.  She does admit to abusing crack cocaine and also THC. She abuses IV heroin.  She does admit to smoking nicotine cigarettes.  She has been to jail on several occasions, released from jail in May 2013 when she was admitted for drug charges. Mother reports she is on probation, but the patient denies the same.  MENTAL STATUS EXAMINATION: The patient is dressed in hospital clothes, seen in the ICU. She is drowsy but is arousable. She said she was in the hospital and she said it was May of 2012, which was wrong.  She stated that President Danae OrleansBush was the current president and later she stated President Obama. She nodded for depression and admits to feeling hopeless and helpless, but denies any suicidal or homicidal  ideas and she reports that she realizes that she overdosed on pills because nobody likes her but not have intention to hurt herself. She reported to the nursing staff that she has been seeing kittens.  Denies hearing voices. Very drowsy and further mental status could not be done at this time. Insight and judgment are guarded.   IMPRESSION:  AXIS I: 1. Alcohol dependence, chronic continuous. 2. THC abuse. 3. Crack cocaine dependence. 4. Heroin dependence. 5. Nicotine dependence.   AXIS II: Borderline personality disorder  with multiple cutting behavior and suicide attempts.  RECOMMENDATIONS: Continue CIWA protocol in CCU as the patient is seeing kittens. Continue observation until the patient comes out of detox. The patient is being referred to Social Services for appropriate placement in the community where she can get help for her substance abuse and at the same time be given help for her cutting behavior and personality problems.  ____________________________ Jannet Mantis. Guss Bunde, MD skc:slb D: 03/28/2012 18:24:50 ET T: 03/29/2012  14:30:49 ET JOB#: [id number]  cc: Monika Salk K. Guss Bunde, MD, <Dictator> Beau Fanny MD ELECTRONICALLY SIGNED 03/29/2012 16:40

## 2014-11-15 NOTE — Discharge Summary (Signed)
PATIENT NAME:  Crystal [name], Crystal [name] MR#:  [id number] DATE OF BIRTH:  October 29, 1970  DATE OF ADMISSION:  03/25/2012 DATE OF DISCHARGE:  03/30/2012  HISTORY OF PRESENT ILLNESS: The patient is a 44 year old female with history of several suicide attempts and also with an overdose and drug use.   HOSPITAL COURSE: The patient was admitted on 03/25/2012 with possible amitriptyline overdose. She was found with an empty bottle of amitriptyline that contained several pills. Her altered mental status was due to a urine infection as well as drug overdose. She has been using cocaine, heroine, and marijuana for the past several weeks and she has been drinking heavily.   The patient was found unconscious which the etiology of the altered mental status could be drugs, alcohol, withdraws, or sepsis. The patient was intubated and put in the Intensive Care Unit. She was extubated on 03/27/2012 and did okay. Unfortunately, the psychiatrist was not able to do a good evaluation as far as commitment. The patient today decided to walk out despite the fact that the nurses were trying to contain her and they apparently were not able to do anything. I had through conversations with the nurses telling them that they could not let the patient go until she had seen a psychiatrist, but unfortunately the patient was not able to be contained. At this moment she left AGAINST MEDICAL ADVICE. I spoke with the psychiatrist about the patient trying to leave AGAINST MEDICAL ADVICE and she was going to evaluate her but apparently the patient just left while I was on the phone with the psychiatrist.   ____________________________ Felipa Furnaceoberto Sanchez Gutierrez, MD rsg:rbg D: 03/30/2012 15:26:36 ET T: 04/01/2012 10:01:17 ET JOB#: [id number]  cc: Felipa Furnaceoberto Sanchez Gutierrez, MD, <Dictator> Allister Lessley Juanda ChanceSANCHEZ GUTIERRE MD ELECTRONICALLY SIGNED 04/03/2012 14:24

## 2014-11-15 NOTE — Discharge Summary (Signed)
PATIENT NAME:  Crystal [name], Crystal [name] MR#:  [id number] DATE OF BIRTH:  1970-12-05  DATE OF ADMISSION:  06/28/2012 DATE OF DISCHARGE:  06/30/2012  CONSULTATIONS: None.    DISCHARGE DIAGNOSES:  1. Nausea and vomiting due to alcoholic ketoacidosis, resolved.  2. Severe hypophosphatemia, status post replacement.  3. Chronic obstructive pulmonary disease exacerbation.  4. Acute bronchitis.  5. Tobacco abuse.  6. EtOH abuse.  7. Hypomagnesemia.   DISCHARGE MEDICATIONS:  1. Thiamine 50 mg p.o. daily. 2. Folic acid 1 mg p.o. daily.  3. Amoxicillin 500 mg p.o. t.i.d. for 10 days. 4. Azithromycin 500 mg p.o. daily for three days.  5. ProAir two puffs 4 times daily as needed for cough and wheezing. 6. Spiriva 18 mcg inhalation 1 puff daily.  7. Advair Diskus 250/50 one puff b.i.d.  8. Prednisone dose taper 20 mg 3 tablets daily for 2 days, 2 tablets daily for 2 days, 1 tablet daily for two days, and then stop.   FOLLOW-UP: Follow-up with Dr. Lacie ScottsNiemeyer on December 10th at 2:20.   HOSPITAL COURSE:  1. The patient is a 44 year old female patient with history of depression, multiple suicidal attempts, alcohol abuse, and drug abuse who came in because of vomiting. On admission pH was 7.27 and anion gap was 23. There were ketones in the urine. The patient did not want to be admitted for detox but she wanted to be admitted for vomiting so she was admitted to the hospitalist service for alcoholic ketoacidosis and started on IV fluids and also started on CIWA protocol with Ativan. She had also received thiamine and folic acid. Her vomiting actually resolved with IV PPIs and IV fluids. Anion gap also closed with the fluids. The patient started to eat. Anion gap was 7 yesterday. She had elevated white count of 23.4 on admission with no evidence of infection so we did not start her on any antibiotics. Her white count did come down to 7.20 yesterday. ,  2. Hypomagnesemia and hypophosphatemia, which are being replaced.  Her phosphorus continued to be low at 0.9 so she did receive multiple doses of Neutra-Phos and repeat phosphorus level this morning is 2.4.  3. Acute bronchitis and COPD exacerbation. The patient had severe cough and wheezing yesterday and we started her on nebulizers, steroids, and antibiotics. This morning lungs are much clearer and she started back on tapering dose of prednisone along with antibiotics and ProAir. She was advised to quit smoking and finish antibiotics and follow-up with Dr. Lacie ScottsNiemeyer.  4. Polysubstance abuse and alcohol abuse. The patient said she is trying to quit alcohol and also substance. She does not have any suicidal ideation and she says that she is on the track to quit alcohol and drugs but she does not want to quit smoking at this time.   DISCHARGE VITALS: Temperature 98.6, pulse 91, respirations 18, blood pressure 122/85, sats 97% on room air.   CONDITION ON DISCHARGE: She is stable for discharge.   TIME SPENT ON DISCHARGE PREPARATION: More than 30 minutes.   ____________________________ Katha HammingSnehalatha Chane Magner, MD sk:drc D: 06/30/2012 11:17:24 ET T: 06/30/2012 13:16:49 ET JOB#: [id number]  cc: Katha HammingSnehalatha Stevens Magwood, MD, <Dictator> Meindert A. Lacie ScottsNiemeyer, MD Katha HammingSNEHALATHA Tekila Caillouet MD ELECTRONICALLY SIGNED 07/14/2012 15:03
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
## 2014-11-15 NOTE — H&P (Signed)
PATIENT NAME:  Crystal [name], Crystal [name] MR#:  [id number] DATE OF BIRTH:  Mar 24, 1971  DATE OF ADMISSION:  06/28/2012  REFERRING PHYSICIAN: Lucrezia Europe, MD   PRIMARY CARE PHYSICIAN: None.   CHIEF COMPLAINT: Vomiting.   HISTORY OF PRESENT ILLNESS: This is a 44 year old female with known history of depression, multiple suicide attempts, alcohol abuse, and drug abuse who presents with complaints of vomiting. The patient is known to have history of heavy alcohol abuse where she drinks up to six cases per night. The patient decided on Friday night to stop drinking so last drink she had was Friday at 11 p.m. The patient reports she has been having significant vomiting all day yesterday for the last 24 hours which prompted her to come to the ED. In the ED the patient had basic work-up done where her labs did show evidence of alcoholic ketoacidosis where she had pH of 7.27 with low CO2 level and with increased anion gap to 23 with evidence of ketones in her urine and normal glucose level. The patient reports she doesn't want to be admitted for alcohol detox but she wants her vomiting issue resolved and taken care of. The patient was given IV thiamine in the ED and she required some IV Ativan and CIWA protocol as she started to have some mild DT symptoms. The patient denies any suicidal attempts or any thoughts to hurt herself or to hurt anyone else. The patient denies any cough, fever, chills, any coffee-ground emesis, any melena, bright red blood per rectum, any diarrhea or constipation.   PAST MEDICAL HISTORY:  1. Attention deficit/hyperactivity disorder.  2. Bipolar disorder.  3. Tobacco abuse.  4. Drug abuse.  5. Alcohol abuse.  6. TBI.  7. 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  8. 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  9. Depression.  10. History of multiple suicide attempts.   PAST SURGICAL HISTORY:  1. Tubal ligation.  2. Vaginal delivery.   FAMILY HISTORY:  Significant for stroke, cardiac disease.   SOCIAL HISTORY: The patient is disabled due to her attention deficit/hyperactivity disorder and loss of her fingers of her hands. She has history of previous incarceration. She smokes 1 to 2 packs of cigarettes a day. She has history of heavy drinking up to six cases per night. As well, she drinks hard liquor. As well, she has history of smoking crack cocaine. Reports last time was last week.   ALLERGIES: Allergy to codeine and morphine.    MEDICATIONS: The patient currently is not taking any meds.   REVIEW OF SYSTEMS: Denies any fever or chills. Complains of fatigue and weakness. EYES: Denies blurry vision, double vision or pain. ENT: Denies tinnitus, ear pain, hearing loss. RESPIRATORY: Denies cough, wheezing, hemoptysis, dyspnea, painful respirations. CARDIOVASCULAR: Denies chest pain, orthopnea, edema, arrhythmia, palpitation. GI: Complains of nausea and vomiting. Denies any diarrhea, abdominal pain, hematemesis, melena, or GERD. GU: Denies dysuria, hematuria, renal colic. ENDOCRINE: Denies polyuria, polydipsia. INTEGUMENTARY: Denies any acne, rash, or skin lesions. MUSCULOSKELETAL: Denies any gout, swelling, redness, or arthritis. NEUROLOGIC: Denies any dysarthria, epilepsy, tremors, vertigo, ataxia, dementia. PSYCH: Has history of depression, alcohol abuse, and substance abuse. Denies any insomnia.   PHYSICAL EXAMINATION:   VITAL SIGNS: Temperature 97.9, pulse 121, respiratory rate 22, blood pressure 122/81, saturating 100% on room air.   GENERAL: Well nourished female who looks comfortable in no apparent distress.   HEENT: Head atraumatic, normocephalic. Pupils equal, reactive to light. Pink  conjunctivae. Anicteric sclerae. Moist oral mucosa.   NECK: Supple. No thyromegaly. No JVD.   CHEST: Good air entry. No wheezing, rales, or rhonchi.   CARDIOVASCULAR: S1, S2 heard. No rubs, murmur, or gallops.   ABDOMEN: Soft, nontender, nondistended.  Bowel sounds present.   EXTREMITIES: No edema. No clubbing. No cyanosis. Negative for joint abnormalities except for amputation of left fingers x2.   SKIN: Has scars in her upper extremities. No petechia. No rash. Normal skin turgor. Warm and dry.   NEUROLOGIC: Cranial nerves grossly intact. Motor strength 5/5 in all extremities.   PSYCHIATRIC: The patient is awake, alert, and oriented x3, mildly lethargic but communicative and responsive. Denies any suicidal or homicidal thoughts.   PERTINENT LABS: Glucose 78, BUN 15, creatinine 0.68, sodium 139, potassium 5.3, chloride 102, CO2 14, anion gap 23, magnesium 1.5, phosphorus 3.6, alkaline phosphatase 126, AST 106, ALT 57, total bilirubin 1.7. Drugs of abuse positive for cocaine. White blood cells 23.4, hemoglobin 14.6, hematocrit 43.2, platelets 189. Urinalysis +2 ketones. ABG showing pH of 7.27.   ASSESSMENT AND PLAN: This is a 44 year old female with known history of alcohol and substance abuse who presents with complaints of vomiting. The patient's last drink was Friday, none since then. 1. Alcoholic ketoacidosis. The patient appears to be having alcoholic ketoacidosis as she is having anion gap of 23 with low bicarb level and normal glucose level with ketones in the urine. The patient was given IV thiamine in the ED. Will continue with IV fluids. Will monitor her electrolytes and replace as needed.  2. Vomiting. This is most likely related to her alcoholic ketoacidosis versus gastritis. Will continue with the above-mentioned management of alcoholic ketoacidosis and will have her on IV Protonix.  3. Alcohol abuse. Will continue the patient on CIWA protocol.  4. Tobacco abuse. Was counseled. Will start her on NicoDerm patch.  5. Polysubstance abuse. The patient was counseled. 6. Hypomagnesemia. Will replace and monitor.  7. Hyperkalemia. Will monitor. Will not give any medication as it is expected to drop down without any intervention secondary to  her vomiting and alcohol abuse.  8. Leukocytosis. This is most likely from ketoacidosis and vomiting. The patient is afebrile. Will monitor closely. 9. Elevated LFTs. This is most likely related to the patient's alcohol abuse. Will monitor closely.  10. DVT prophylaxis. Sub-Q heparin.  11. GI prophylaxis. PPI.   CODE STATUS: FULL CODE.   TOTAL TIME SPENT ON ADMISSION AND PATIENT CARE: 55 minutes.   ____________________________ Starleen Armsawood S. Iori Gigante, MD dse:drc D: 06/28/2012 06:46:53 ET T: 06/28/2012 11:07:47 ET JOB#: [id number]  cc: Starleen Armsawood S. Kyle Luppino, MD, <Dictator> Tarahji Ramthun Teena IraniS Guadalupe Nickless MD ELECTRONICALLY SIGNED 06/29/2012 4:32

## 2014-11-15 NOTE — Consult Note (Signed)
Brief Consult Note: Diagnosis: Bipolar Disorder, Alcohol and Cocaine Abuse.   Patient was seen by consultant.   Recommend further assessment or treatment.   Orders entered.   Discussed with Attending MD.   Comments: Crystal [name] is a 44 y/o Caucasian female with a history of Bipolar Disorder as well as Alcohol and Cocaine abuse who was brought to the hospital under IVC taken out by the police due to possible overdose of Seroquel in context of alcohol use. Ethanol level is in the 200s. The patient was not able to communicate with the writer as she was very lethargic. She did receive charcoal from the ER Physician. Per the intake nurse she had called her sister and threatened suicide. Will [facility] for medication management, safety and stabilization once medically cleared. Please keep under IVC at this time.  Electronic Signatures: Caryn SectionKapur, Alphonsus Doyel (MD)  (Signed 25-Oct-13 15:57)  Authored: Brief Consult Note   Last Updated: 25-Oct-13 15:57 by Caryn SectionKapur, Mikhi Athey (MD)

## 2014-11-15 NOTE — H&P (Signed)
PATIENT NAME:  Crystal [name], Crystal [name] MR#:  [id number] DATE OF BIRTH:  1970-08-16  DATE OF ADMISSION:  03/25/2012  REASON FOR ADMISSION:  Altered mental status, acute drug intoxication.   HISTORY OF PRESENT ILLNESS: Ms. Woodmansee is a 44 year old female who has history of severe depression, prior suicide attempt, alcohol abuse and drug abuse. The patient was brought by EMS today. She is at this moment intubated, and she is not able to give me any information; but I was able to get most of the information with her mother, who was very helpful. Apparently the patient was very upset yesterday because they had to bury her uncle with a drug overdose. The patient wanted her boyfriend to stay overnight with her, but since she has been living in her mom's house since she came out of jail her mom said no, and that made her really upset. Apparently the patient went to bed, and this morning around 10:00 a.m. she was found on the middle of the floor of her room dressed with different clothes than she was wearing yesterday. So, the mother says that the patient several open bottles of medications, mostly the one that was able to be identified was amitriptyline; and there is no saying how many pills she could have taken. Apparently, the patient had these bottles since the end of July for which she should not have that many if she has been taking it, although her mom says that she has not been taking them on a regular basis. As per her mom,  the patient did not have any vomiting, did not have stool or urinary incontinence; but it is not known for how long the patient was down prior to the event.  The patient is being intubated, and at this moment she is under sedation with midazolam. Her ventilator settings are assist control with a rate of 14 and a PEEP of 5 with tidal volume of 500, and she seems to be very comfortable. As per her mom, the patient has had multiple episodes of suicidal events, and she is also a heavy drinker. She has  been using heroine and crack cocaine within the last week. The patient apparently had an event of shaking and passing out about a day or two ago that could be a seizure. Apparently her mom was told by her boyfriend that it happened, and it was going to get better; then she woke up and was absolutely normal. There is no history of prior seizures as far as we can tell. No more other information can be collected due to the patient's altered mental status.   REVIEW OF SYSTEMS: Unable to obtain due to altered mental status.   PAST MEDICAL HISTORY:  1. Attention deficit/hyperactivity disorder.  2. Bipolar disorder.  3. Smoker.  4. Drug abuse.  5. Alcohol abuse.  6. TBI.  7. 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  8. History of asthma.  9. History of brain aneurysm with seizure x1 at that moment.  10. Depression.  11. Multiple suicide attempts.   PAST SURGICAL HISTORY:  1. Tubal ligation.  2. Vaginal delivery.   FAMILY HISTORY: Stroke, myocardial infarction and congestive heart failure on her grandmother. There are multiple members of her family with suicide attempts and drug overdose history.   ALLERGIES: Apparently the patient is allergic to codeine and morphine. They cannot tell me what reaction.   SOCIAL HISTORY: The patient is disabled due to her attention deficit/hyperactivity disorder and  the loss of her fingers of her hands. She has been in prison and she was released not so long ago. She has been living with her mom. She smokes 1 to 2 packs of cigarettes a day, and she has been smoking since her teenager years. She is a heavy drinker. She drinks everyday as much as she can, sometimes one, sometimes up to when she gets drunk. She usually drinks vodka.    CURRENT MEDICATIONS: straterra  , Nexium, and amitriptyline.   PHYSICAL EXAMINATION:  VITAL SIGNS: The patient is at the ER department. Her vital signs are blood pressure 139/99, pulse 111 to 112,  respiratory rate  20, oxygen saturation 100% on the ventilator with the settings mentioned above.   GENERAL: The patient is sedated, unresponsive. She withdraws to pain and occasionally moves her extremities or moans.   HEENT: Her pupils are equal and reactive but sluggishly. Her extraocular movements I am not able to explore. She has no doll eyes. vestibular test intact  tests are normal. Her mucosa is dry. No oropharyngeal exudates.  ET tube is  23 cm. No nasal secretions.  NECK: Supple. No JVD. No thyromegaly. No adenopathy. Normal range of motion. No palpable adenopathies. Her sclerae are anicteric. Her conjunctivae are pink.   CARDIOVASCULAR: Regular rate and rhythm. No murmurs, rubs, or gallops. There are no thrills. There are no eschars on her chest. Apical heart tones are palpable at the left middle clavicular line, nondisplaced.   LUNGS: Clear without any wheezing or crepitus. Normal air entrance. At this moment she is intubated for which there is no use of accessory muscles. There is no dullness to percussion.   ABDOMEN: Soft, nontender, nondistended. No hepatosplenomegaly. No masses. Bowel sounds are positive. No guarding, no rebound. No hepatic stigmata is observed.   GENITAL: Exam is negative for external lesions. The patient has history of herpes, but there are no active lesions Foley catheter in place.   EXTREMITIES: No edema. No cyanosis. No clubbing. Multiple left hand lacerations with clusters. No bleeding. Pulses are +2. Deep tendon reflexes are +3. Sensation: Unable to obtain Babinski is negative.   SKIN: As mentioned above, lesions in clusters. There is a little bit of tracks on her forearm. There is no petechia. Skin is palpable without any significant edema.   NEUROLOGICAL: As mentioned above, the patient has altered mental status under sedation. No focal exam is observed.   PSYCHIATRIC: Unable to also obtain due to sedation.   LYMPHATICS: Negative for lymphadenopathy.    MUSCULOSKELETAL: Negative for joint abnormalities except for amputation of left fingers x2.   LABORATORY, DIAGNOSTIC AND RADIOLOGICAL DATA: Sodium is 140, potassium 3.8 glucose 135, C02 is 27, calcium 8.1, magnesium 1.3. AST is elevated at 117. UDS positive for tricyclics. Alcohol level is undetectable. Hemoglobin is 11.7, white count is 10.2, platelets 118. Arterial blood gas: pH 7.40, PO2 is 100, HCO3 is 26. Lactic acid is 1.30. EKG: No QT elongation, no QRS elongation. No signs of arrhythmias. Normal sinus rhythm. No ST depression or elevation.  ASSESSMENT AND PLAN:  1. Altered mental status: The patient was brought with altered mental status due to possible drug overdose versus sepsis. The patient has a urinary tract infection which could be contributing with this. At this moment the patient is having IV fluids, ventilatory support, and sedation. Tomorrow morning we are going to try to lower down the propofol drip and see how she reacts and try to extubate in am..   2. Respiratory  failure: The patient was admitted for altered mental status, respiratory failure due to protection of airway with intubation. The patient is at this moment ventilated very well in assist control. We are going to give a trial of extubation in the morning if the patient is awake.  3. Drug overdose/possible tricyclic overdose: The patient is being treated for tricyclic overdose with one dose of sodium bicarbonate. There are a couple of things that do not fit with overdose with tricyclic--there is no hypotension, and there are no EKG changes. Our plan is going to be to keep her with vital support, IV fluids. At this moment her QRS is much less than 100 ms, so we are going to start her on a bicarbonate drip if the QRS segment elongates more than 100 milliseconds, or if the patient gets hypotensive or if there is any other significant ventricular arrhythmias. There is no use for charcoal since the intoxication apparently happened  last night, and the recommendation is to do it within two hours of acute intoxication. EKG serials are going to be taken to monitor QRS, and the patient is going to be under telemetry monitoring as well.  4. History of alcohol abuse: The patient apparently had an episode that might have sounded like a seizure a couple of days ago. She has been drinking very heavily for the past several months, and at this moment her ethanol level is undetectable in blood. For that reason, we are going to her on the CIWA  protocol and give her some thiamine at this moment since this could be Wernicke syndrome even though she doesn't exhibit oculomotor abnormalities.  I am going to go ahead and give her thiamine 500 mg IV x3, then titrate it through the next several days.  5. Hypomagnesemia: Likely due to severe alcoholism: At this moment, we are going to replace with magnesium IV.  6. Elevated LFTs: Likely due to alcohol abuse. We are going to follow up on the those as well. 7. Anemia: The patient has normocytic anemia, minimal amount with a hemoglobin of 11.7. We are going to start off this on the next couple of days with iron levels.  8. Thrombocytopenia: The patient has low platelet count of 118, no bleeding. This could be due to her drinking for which we are going to replace folic acid which could help the platelets if this is a sepsis with consumption of platelets.  9. Urinary tract infection: The patient has elevated white blood count of 173 and signs of urinary infection for which she has been put on Rocephin. This could be also causing changes in her mental status.  10. The patient is a smoker: We are going to add nebulizers p.r.n.  11. For deep vein thrombosis prophylaxis, her platelets are low but not significantly.  I am going to add on heparin subcutaneous.   TIME SPENT: Time spent with this patient was from 3:00 p.m. to 4:25, about  80 minutes. with out counting procedure  time.  ____________________________ Felipa Furnace, MD rsg:[id number] D: 03/25/2012 16:51:52 ET T: 03/25/2012 17:54:40 ET JOB#: [id number]  cc: Felipa Furnace, MD, <Dictator> Senie Lanese Juanda Chance MD ELECTRONICALLY SIGNED 03/26/2012 0:08
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
## 2014-11-18 NOTE — Consult Note (Signed)
Brief Consult Note: Diagnosis: alcohol dependence.   Patient was seen by consultant.   Consult note dictated.   Comments: Psychiatry: Patient seen and chart reviewed. Seizure precipitated by alcohol withdrawl. Now stable and not delirius. Not interested in any SA treatment. Education done. No further treatment indicated.  Electronic Signatures: Audery Amellapacs, John T (MD)  (Signed 03-Dec-14 13:43)  Authored: Brief Consult Note   Last Updated: 03-Dec-14 13:43 by Audery Amellapacs, John T (MD)

## 2014-11-18 NOTE — H&P (Signed)
PATIENT NAME:  Crystal [name], [name] MR#:  [id number] DATE OF BIRTH:  05-14-1971  DATE OF ADMISSION:  06/29/2013  PRIMARY CARE PHYSICIAN: Dr. Lacie ScottsNiemeyer. The patient is going start seeing Dr. Leotis [facility]amesJasmine Singh at [facility] from January 2015.   CHIEF COMPLAINT: Abdominal pain, vomiting and possible alcohol-related seizures.   HISTORY OF PRESENT ILLNESS: Crystal [name] is a 44 year old Caucasian female who has history of chronic alcoholism, alcohol-related seizures, history of anorexia and bulimia in the past, who comes into the Emergency Room after her husband woke up having seen the patient having generalized tonic-clonic seizures in early hours of the morning. The patient did have some episodes of vomiting. She remained postictal for some time and then started having abdominal pain and vomiting.   In the Emergency Room, she is hemodynamically stable, no seizure. She did not have any other repeat seizures or any episodes of vomiting. In fact, she is feeling hungry, wants to eat. She is being admitted for alcohol-related seizures, alcohol hepatitis, and severe hypomagnesemia.   PAST MEDICAL HISTORY:  1.  Chronic alcoholism.  2.  Genital herpes.  3.  ADHD. 4.  Bipolar disorder.  5.  History of bulimia and anorexia in the past.  6.  Polysubstance abuse.  7.  History of alcohol-related seizures.   CURRENT MEDICATIONS:  1.  Seroquel XR 150 mg p.o. daily.  2.  Lyrica 75 mg p.o. b.i.d.  3.  Keppra 750 mg p.o. b.i.d.  4.  Gabapentin 300 mg b.i.d.  5.  Clonazepam 1 mg b.i.d.  6.  Acetaminophen/oxycodone 325/5 mg one tablet every 12 hours as needed.   ALLERGIES: CODEINE AND HYDROCODONE.   FAMILY HISTORY: Positive for polysubstance dependence.   SOCIAL HISTORY: She is currently married and per husband, she drinks on a daily basis vodka. She does not quantify. Her last drink was yesterday evening. Over the holiday weekend that is Thanksgiving, she had excessive drinking with mainly vodka. She also has  been noncompliant to her home medications. Continues to smoke. Denies any use of street drug recently.   REVIEW OF SYSTEMS: CONSTITUTIONAL: No fever. Positive for fatigue and weakness.  EYES: No blurred or double vision, pain or redness.  ENT: No tinnitus, ear pain, hearing loss, postnasal drip.  RESPIRATORY: No cough, wheeze, hemoptysis or COPD.  CARDIOVASCULAR: No chest pain, orthopnea, edema. No hypertension.  GASTROINTESTINAL: Positive for nausea, vomiting and abdominal pain. Positive for GERD. No rectal bleed or melena.  GENITOURINARY: No dysuria or hematuria.  ENDOCRINE: No polyuria, nocturia or thyroid problems.  HEMATOLOGY: Positive for anemia, chronic. No easy bruising or bleeding.  SKIN: No acne or rash.  MUSCULOSKELETAL: Positive for back pain and arthritis.  NEUROLOGIC: No CVA or TIA. Positive for seizure disorder secondary to alcoholism.  PSYCHIATRIC: Positive for bipolar disorder and anxiety.   All other systems reviewed are negative.   PHYSICAL EXAMINATION:  GENERAL: Awake. She is alert. She is oriented x3, not in acute distress.  VITAL SIGNS: She is afebrile. She is tachycardic with heart rate in the 102s. Temperature is 98.7. Blood pressure is 112/86. Sats are 98% on room air.  HEENT: Atraumatic, normocephalic. PERRLA. EOM intact. Oral mucosa is dry.  NECK: Supple. No JVD. No carotid bruit.  LUNGS: Clear to auscultation bilaterally. No rales, rhonchi, respiratory distress or labored breathing.  CARDIOVASCULAR: Tachycardia present. No murmur heard. PMI not lateralized. Chest nontender.  EXTREMITIES: Feeble pedal pulses. Good femoral pulses. No lower extremity edema.  ABDOMEN: Soft. There is some tenderness present in the  epigastric area. No guarding, rigidity. No mass felt. Positive bowel sounds.  NEUROLOGIC: Grossly intact cranial nerves II through XII. No motor or sensory deficits. Globally weak, however, no focal weakness.  SKIN: Warm and dry.   LABORATORY DATA:  Creatinine is 0.45. Sodium is 135. Potassium is 3.0. Chloride is 100. Calcium is 8.3. Bilirubin is 3.8. SGOT is 180. Albumin is 3.5 and magnesium is 0.6. Serum ethanol is less than 0.003. Lipase 617. Urine pregnancy test negative. Urine drug screen positive for tricyclic antidepressants and benzodiazepines. Urinalysis negative for UTI. Hemoglobin and hematocrit are 13.4 and 39.1. Platelet count is 68,000. CT of the abdomen and pelvis with contrast shows no hepatomegaly and significant steatosis. Smaller right adnexal cyst, favoring to physiology process. No evidence for ascites or abscess.   ASSESSMENT: Crystal [name], a 44 year old, with a history of chronic alcoholism with alcohol-related seizures, depression and anxiety comes in with:  1.  Suspected alcohol-related seizures. The patient apparently had stopped taking her Keppra and clonazepam for the last few days. She had been drinking excessively during the Thanksgiving holidays per husband. The patient was noted to have generalized tonic-clonic seizures per husband. Currently no more seizures in the Emergency Room. Will admit patient to 2A with seizure precautions. Will start the patient on IV Keppra 750 b.i.d, her home dose, p.r.n. Ativan for seizure and will initiate CIWA protocol.  2.  Alcohol-induced hepatitis. Will continue to monitor LFTs. We will give multivitamin, IV fluids, and the patient was advised strongly on alcohol cessation. Her abdominal CAT scan shows with hepatic steatosis.  3.  Anxiety and depression. The patient is on Klonopin and Seroquel which we will resume.  4.  Severe hypomagnesemia in the setting of chronic alcoholism. Will repeat IV and p.o. and check in the morning.  5.  Hypokalemia. Will continue IV replacement of potassium.  6.  Acute mild pancreatitis, serologically along with clinically. Having abdominal pain, nausea and vomiting. Will keep patient on clear liquid diet, IV p.r.n. Zofran. Check lipase in the morning.  Advance diet as tolerated.  7.  Chronic thrombocytopenia, again, secondary to chronic alcoholism. No active bleeding noted.  8.  Deep vein thrombosis prophylaxis. Thromboembolic disease stockings  and sequential compression devices.  9.  Gastrointestinal prophylaxis. Will put the patient on Prilosec daily.  10.  Further work-up according to the patient's clinical course. Hospital admission plan was discussed with the patient and the patient's husband, who are agreeable to it.   TIME SPENT: 50 minutes.   ____________________________ Wylie Hail Allena Katz, MD sap:np D: 06/29/2013 16:25:02 ET T: 06/29/2013 16:59:31 ET JOB#: [id number]  cc: Shahzad Thomann A. Allena Katz, MD, <Dictator> Meindert A. Lacie Scotts, MD Leotis Shames, MD Willow Ora MD ELECTRONICALLY SIGNED 07/09/2013 11:08

## 2014-11-18 NOTE — Discharge Summary (Signed)
PATIENT NAME:  Crystal [name], Crystal [name] MR#:  [id number] DATE OF BIRTH:  19-Jul-1971  DATE OF ADMISSION:  06/29/2013 DATE OF DISCHARGE:  06/30/2013  The patient actually signed out on December 3rd.  DISCHARGE DIAGNOSIS:  1.  Seizures due to noncompliance with Keppra. 2.  History of alcohol related seizures.  3.  Alcoholic pancreatitis.  4.  Alcoholic hepatitis.  5.  Possible cirrhosis.  6.  History of bipolar disorder. 7.  Hypomagnesemia and hypokalemia.  8.  Tobacco abuse.   CONSULTANTS: Psychiatry with Dr. Weber Cooks.   HOSPITAL COURSE:  1.  A 44 year old female patient admitted for abdominal pain, vomiting, and seizures. 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 The patient is supposed to take Keppra 750 mg p.o. b.i.d., but she said she has not taken Keppra for 2 to 3 days because of nausea and vomiting. The patient was postictal when she came. She was admitted to telemetry, started on Keppra, clonazepam, and placed on CIWA protocol. The patient remained seizure-free and for the next 24 hours did not have any seizures. Advised her to take Keppra as she should at 750 mg p.o. b.i.d.  2.  Alcohol abuse. The patient has long-standing history of chronic alcohol abuse. She has tried detox at multiple psych admissions. She says it never worked for her. During Thanksgiving she admits that she took around 8 to 10 drinks of alcohol every day an don a regular basis she drinks about 4 to 6 drinks a day. When she had the seizure, she was not drinking in about 24 hours. The patient was seen by Dr. Weber Cooks. The patient was started on CIWA protocol. Yesterday she did not have any tremors or withdrawal symptoms.  3.  Pancreatitis due to alcohol abuse. Lipase on admission was 617. The patient was started on only clear liquids. The patient did not have any abdominal pain yesterday. She really wanted to eat so changed her diet. Lipase did  come down from 617 to 399 on December 3.   4.  The patient had alcoholic hepatitis. LFTs on admission showed elevated AST at 187. The patient also had alk phos of 261 with AST 47. The patient had a CT adomen which showed no evidence of ascites showed mild pancreatitis, 5.  Severe hypomagnesemia.  Magnesium was 0.6 on admission and after replacement it improved to 2.9.   The patient's condition was stable. She signed out AMA yesterday afternoon.  TIME SPENT ON DISCHARGE PREPARATION: More than 30 minutes.  ____________________________ Epifanio Lesches, MD sk:sb D: 07/01/2013 12:00:40 ET T: 07/01/2013 12:18:30 ET JOB#: [id number]  cc: Epifanio Lesches, MD, <Dictator> Meindert A. Brunetta Genera, MD Epifanio Lesches MD ELECTRONICALLY SIGNED 07/07/2013 22:53

## 2014-11-19 NOTE — H&P (Signed)
PATIENT NAME:  Crystal [name], Crystal [name] MR#:  [id number] DATE OF BIRTH:  1971/05/17  DATE OF ADMISSION:  10/12/2013  DICTATING FOR:  Glendon Axe, MD  CHIEF COMPLAINT: Abdominal pain.   HISTORY OF PRESENT ILLNESS: A 44 year old with a history of alcoholic liver disease, chronic thrombocytopenia, hepatic steatosis and tobacco abuse who presented to the clinic yesterday, 03/16, with complaints of abdominal pain and increasing fatigue and fever. She has also had increasing shortness of breath, wheezing with a cough. She was further evaluated yesterday with lab work including CBC, MET-C as well as abdominal ultrasound. Labs came back today, 03/17, showing elevated white blood count 22,000, hemoglobin of 11, platelets 261. A pro time of 14.6. An alkaline phosphatase of 245, total bilirubin of 7.5, BUN of 5, creatinine 0.4, AST 141, ALT 40 and a sodium of 135. Her PTT was 33 and her ammonia level was 244. She returns to clinic today for admission with concerns for spontaneous bacterial peritonitis. She has had abdominal discomfort for several months with increasing distention over the last several days. Her fever has just been for the last few days with temperatures around 100.4. She denies any hematemesis or nausea. She denied any confusion. She does have a history of chronic back pain and received an epidural injection last week with some benefit. She does have history of alcohol abuse and continues to drink daily, also continues to smoke. She has a history of possible seizure activity related to alcohol use and is taking Keppra faithfully. She denies any lower extremity edema. She has had a rash on her trunk and upper extremities for "some time." She was evaluated in the hospital in December 2014 for abdominal pain and CT of the abdomen showed hepatomegaly with hepatic steatosis. She had negative labs, negative hepatitis B and C screening at that time.   PAST MEDICAL HISTORY: 1.  Chronic alcoholism.  2.   Alcohol-related seizures.  3.  Alcoholic liver disease.  4.  Chronic thrombocytopenia.  5.  Hepatic steatosis.  6.  Bipolar disorder with suicide attempt in the past.  7.  History of bulimia and anorexia in the past.  8.  Polysubstance abuse.  9.  Brain aneurysm.   ALLERGIES: HYDROCODONE CAUSES ITCHING.   CURRENT MEDICATIONS: 1.  Keppra 500 mg q.a.m., 750 mg q.p.m.  2.  Lyrica 75 mg b.i.d.  3.  Albuterol nebulizers q.i.d. p.r.n.  4.  Milk of magnesia daily.  5.  Seroquel XR 150 mg at bedtime.  6.  Klonopin 1 mg b.i.d.  7.  Nexium 40 mg daily.  8.  Phenergan 25 mg t.i.d. p.r.n.  9.  Spironolactone 25 mg daily, started 03/16.   SOCIAL HISTORY: Married, 1 child. History of tobacco abuse, 1 pack per day, and daily alcohol use.   FAMILY HISTORY: Positive for asthma, hypertension and arthritis. Liver disease in her aunt and grandfather secondary to alcohol abuse.   REVIEW OF SYSTEMS: As per HPI.    PHYSICAL EXAMINATION: GENERAL: Middle-aged female appearing fatigued with a yellow tint to her sclerae and skin.  HEENT: Head is normocephalic, atraumatic. Pupils equal, round and reactive to light. EOMs are intact. Sclerae are icteric. Her oropharynx is clear with dentition intact. Ears show normal landmarks with no effusions or redness.  NECK: Supple with no adenopathy, no JVD.  HEART: Regular rhythm with rate in the low 100s. No murmurs noted.  LUNGS: Bilateral wheezing and rhonchi.  ABDOMEN: Distended with generalized tenderness and hypoactive bowel sounds. No hepatojugular reflux noted. Tender  over the right upper quadrant in particular.  SKIN: Spider hemangiomas noted on the upper trunk and upper extremities, yellow tint to her skin generally.   EXTREMITIES: No edema, 2+ pulses, symmetric.  NEUROLOGICAL: Nonfocal.   ASSESSMENT AND PLAN: 1.  End-stage liver disease with acute leukocytosis. Concern for spontaneous bacterial peritonitis. White count on 03/16 is 22,000. Repeat CBC with  LDH and amylase. We will look into paracentesis with testing fluid for cell count and differential, Gram stain, albumin, protein, LDH, glucose, amylase, bilirubin with aerobic and anaerobic cultures. If pmn count is greater than 250 cells, we will suspect spontaneous bacterial peritonitis. We will consult gastroenterology as well. After paracentesis been completed, we will initiate cefotaxime 2 grams IV q.8 hours. Continue spironolactone 25 mg a day and will need Kayexalate for her elevated ammonia level.  2.  History of seizures, alcohol-related. Continue Keppra.  3.  Chronic back pain. Continue Lyrica. Given oxycodone 5 mg every 8 hours p.r.n.   ____________________________ Marily Lente. Natilee Gauer, PA-C rjt:cs D: 10/12/2013 17:23:00 ET T: 10/12/2013 18:06:02 ET JOB#: [id number]  cc: Marily Lente. Lolita Lenz, <Dictator> Leonard Downing PA ELECTRONICALLY SIGNED 10/18/2013 14:04

## 2014-11-19 NOTE — Consult Note (Signed)
Brief Consult Note: Diagnosis: ETOH cirrhosis.   Patient was seen by consultant.   Consult note dictated.   Comments: 1.) ETOH Cirrhosis:    She does not appear to be encephalopathic.  Serial ammonia is not a reliable clinical indicator and a mildly elev ammonia can be disregarded.   Lactulose 30 ml q8 hrs is reasonable for d/c and she should titrate to about 2 stools per day.  No ascites on imaging.   She needs to be on 1 gr Na diet with no fluid restriction.  High protein diet. ETOH avoidance.   I will see her in liver clinic for follow up.   There is no indication for her to remain in the hospital in terms of her liver disease.  Electronic Signatures: Dow Adolphein, Matthew (MD)  (Signed 07-May-15 14:58)  Authored: Brief Consult Note   Last Updated: 07-May-15 14:58 by Dow Adolphein, Matthew (MD)

## 2014-11-19 NOTE — Discharge Summary (Signed)
Dates of Admission and Diagnosis:  Date of Admission 12-Oct-2013   Date of Discharge 19-Oct-2013   Admitting Diagnosis Spontaneous Bacterial Peritonitis   Final Diagnosis Spontaneous Bacterial Peritonitis   Discharge Diagnosis 1 Spontaneous Bacterial Peritonitis   2 Alcoholic Liver disease   3 Chronic alcoholism   4 COPD exacerbation   5 Simple right adnexal cyst    Chief Complaint/History of Present Illness 44 year old lady with h/o chronic alcoholism, alcoholic liver disease, chronic thrombocytopenia and hepatic steatosis (on prior CT abdomen) presented to to the office c/o diffuse abdominal pain and low grade fever. labs revealed leukocytosis. Patient's presentation was suggestive of Spontaneous Bacterial Peritonitis. She was admitted for further work up and treatment with i.v antibiotics.   Allergies:  Codeine: Itching  Hydrocodone: Other  Routine Micro:  17-Mar-15 17:29   Micro Text Report BLOOD CULTURE   COMMENT                   IN AEROBIC BOTTLE ONLY   COMMENT                   ID TO FOLLOW   GRAM STAIN                GRAM NEGATIVE COCCO-BACILLI   ANTIBIOTIC                       Micro Text Report BLOOD CULTURE   COMMENT                   NO GROWTH AEROBICALLY/ANAEROBICALLY IN 5 DAYS   ANTIBIOTIC                       Culture Comment IN AEROBIC BOTTLE ONLY  Culture Comment NO GROWTH AEROBICALLY/ANAEROBICALLY IN 5 DAYS  Result(s) reported on 17 Oct 2013 at 05:00PM.  Culture Comment . ID TO FOLLOW  Gram Stain 1 GRAM NEGATIVE COCCO-BACILLI  18-Mar-15 08:45   Micro Text Report URINE CULTURE   COMMENT                   NO GROWTH IN 18-24 HOURS   ANTIBIOTIC                       Specimen Source CLEAN CATCH  Culture Comment NO GROWTH IN 18-24 HOURS  Result(s) reported on 14 Oct 2013 at 11:05AM.  22-Mar-15 16:32   Micro Text Report CLOSTRIDIUM DIFFICILE   C.DIFFICILE ANTIGEN       C.DIFFICILE GDH ANTIGEN : POSITIVE   C.DIFFICILE TOXIN A/B     C.DIFFICILE  TOXINS A AND B : NEGATIVE   PCR FOR TOXIGENIC C.DIFF  PCR FOR TOXIGENIC C.DIFFICILE : NEGATIVE   INTERPRETATION Negative for toxigenic  C. difficile. Toxin gene and active toxin production not detected. May be a nontoxigenic strain of C. difficile bacteria present, lacking the ability to produce toxin.    ANTIBIOTIC                        General Ref:  18-Mar-15 14:38   Hepatitis Panel A, B, C ========== TEST NAME ==========  ========= RESULTS =========  = REFERENCE RANGE =  HEPATITIS PANEL A, B, C  HP5+HAVIgM+HBcIgM Hep A Ab, IgM                   [   Negative             ]  Negative Hep A Ab, Total                 [   Positive             ]          Negative HBsAg Screen                    [   Negative             ]          Negative Hep B Core Ab, IgM              [   Negative             ]          Negative Hep B Core Ab, Tot              [   Negative             ]         Negative Hep B Surface Ab, Qual          [   Non Reactive         ]                                                Non Reactive: Inconsistent with immunity,                                            less than 10 mIU/mL            Reactive:     Consistent with immunity,                                            greater than 9.9 mIU/mL HCV Ab                          [   <0.1 s/co ratio      ]           0.0-0.9 Comment:                        [   Final Report    ]                   Non reactive HCV antibody screen is consistent with no HCV infection, unless recent infection is suspected or other evidence exists to indicate HCV infection.               [facility]            No: [id number]          [address] [place], [place] [address]           Mila Homer, MD         [phone]   Result(s) reported on 14 Oct 2013 at 04:52PM.  Antinuclear Antibodies Direct ========== TEST NAME ==========  ========= RESULTS =========  = REFERENCE RANGE =  ANTI-NUCLEAR ABS  DIRECT  Antinuclear  Antibodies Direct ANA Direct                      [   Negative             ]          Negative               [facility]            No: [id number]           [address], [place], [place] [id number]           Mila HomerWilliam F Hancock, MD         [phone]   Result(s) reported on 14 Oct 2013 at 01:19PM.  Mitochondrial (M2) Antibody ========== TEST NAME ==========  ========= RESULTS =========  = REFERENCE RANGE =  MITOCHONDRIAL (M2) AB  Mitochondrial (M2) Antibody Mitochondrial (M2) Antibody     [   4.4 Units            ]          0.0-20.0              Negative    0.0 - 20.0                                                Equivocal  20.1 - 24.9                                                Positive         >24.9                                                                    .              Mitochondrial (M2) Antibodies are found in 90-96% of                patients with primary biliary cirrhosis.               [facility]            No: [id number]           [address], [place], [place] [id number] Mila HomerWilliam F Hancock, MD         [phone]   Result(s) reported on 14 Oct 2013 at 04:52PM.  Routine Coag:  19-Mar-15 05:29   Prothrombin  16.3  INR 1.3 (INR reference interval applies to patients on anticoagulant therapy. A single INR therapeutic range for coumarins is not optimal for all indications; however, the suggested range for most indications is 2.0 - 3.0. Exceptions to the INR Reference Range may include: Prosthetic heart valves, acute myocardial infarction, prevention of myocardial infarction, and combinations of aspirin and anticoagulant. The need for a higher or lower target INR must be assessed individually. Reference: The Pharmacology and Management of the Vitamin K  antagonists: the seventh ACCP Conference on Antithrombotic  and Thrombolytic Therapy. Chest.2004 Sept:126 (3suppl): L7870634. A HCT value >55% may artifactually increase the PT.  In  one study,  the increase was an average of 25%. Reference:  "Effect on Routine and Special Coagulation Testing Values of Citrate Anticoagulant Adjustment in Patients with High HCT Values." American Journal of Clinical Pathology 2006;126:400-405.)  Routine Hem:  17-Mar-15 17:29   WBC (CBC)  21.1  18-Mar-15 10:01   WBC (CBC)  24.3  19-Mar-15 05:29   WBC (CBC)  19.7  20-Mar-15 04:11   WBC (CBC)  26.6  22-Mar-15 04:50   WBC (CBC)  25.8  24-Mar-15 06:23   WBC (CBC)  24.4   PERTINENT RADIOLOGY STUDIES: XRay:    18-Mar-15 17:44, Chest PA and Lateral  Chest PA and Lateral   REASON FOR EXAM:    rhonchi with expiratory wheezes  COMMENTS:       PROCEDURE: DXR - DXR CHEST PA (OR AP) AND LATERAL  - Oct 13 2013  5:44PM     CLINICAL DATA:  Rhonchi with expiratory wheezes    EXAM:  CHEST  2 VIEW    COMPARISON:  IR VASCULAR PROCEDURE - NRPT dated 10/13/2013; DG CHEST  2V dated 03/04/2013    FINDINGS:  Right PICC line identified with tip projecting over the superior  vena cava in the region of the azygos arch. Limited inspiratory  effect. Heart size and vascular pattern are normal. The left lung is  clear. There are streaky opacities in the right lower lobe which are  most consistent with discoid atelectasis. There are no pleural  effusions.     IMPRESSION:  Discoid atelectasis right lower lobe. Limited inspiratory effect  bilaterally.      Electronically Signed    By: Esperanza Heir M.D.    On: 10/13/2013 17:47     Verified By: Otilio Carpen, M.D.,  [place]:    19-Mar-15 13:46, US Pelvis Ultrasound Exam with Transvaginal - NON-OB  US Pelvis Ultrasound Exam with Transvaginal - NON-OB   REASON FOR EXAM:    Right adnexal cyst  COMMENTS:       PROCEDURE: [place]  - US PELVIS EXAM W/TRANSVAGINAL  - Oct 14 2013  1:46PM     CLINICAL DATA:  Follow-up right adnexal cyst on CT    EXAM:  TRANSABDOMINAL AND TRANSVAGINAL ULTRASOUND OF PELVIS    TECHNIQUE:  Both transabdominal and  transvaginal ultrasound examinations of the  pelvis were performed. Transabdominal technique was performed for  global imaging of the pelvis including uterus, ovaries, adnexal  regions, and pelvic cul-de-sac. It was necessary to proceed with  endovaginal exam following the transabdominal exam to visualize the  endometrium and bilateral ovaries.    COMPARISON:  CT abdomen pelvis dated 10/13/2013    FINDINGS:  Uterus    Measurements: 7.6 x 3.3 x 5.2 cm. No fibroids or other mass  visualized.    Endometrium    Thickness: 10 mm.  No focal abnormality visualized.  Right ovary    4.3 x 3.0 x 3.8 cm simple right adnexal cyst. Discrete ovarian  tissue is not visualized.    Left ovary    Measurements: 2.4 x 1.2 x 2.5 cm. Normal appearance/no adnexal mass.    Other findings    Small volume pelvic ascites.     IMPRESSION:  4.3 cm simple right adnexal cyst.  This is almost certainly benign, and no specific imaging follow up  is recommended according to the Society of Radiologists in  Ultrasound 2010 Consensus  Conference Statement Grier Rocher et al.  Management of Asymptomatic Ovarian and Other Adnexal Cysts Imaged at  [place]: Society of Radiologists in Ultrasound Consensus Conference  Statement 2010. Radiology 256 (Sept 2010): 943-954.).      Electronically Signed    By: Charline Bills M.D.    On: 10/14/2013 13:56         Verified By: Charline Bills, M.D.,  LabUnknown:  PACS Image   CT:    18-Mar-15 17:28, CT Abdomen and Pelvis Without Contrast  CT Abdomen and Pelvis Without Contrast   REASON FOR EXAM:    (1) distention, firm with pain; (2) distention and   unable to void  COMMENTS:       PROCEDURE: CT  - CT ABDOMEN AND PELVIS W0  - Oct 13 2013  5:28PM     CLINICAL DATA:  General abdominal pain and distention with diarrhea,  unableto void    EXAM:  CT ABDOMEN AND PELVIS WITHOUT CONTRAST    TECHNIQUE:  Multidetector CT imaging of the abdomen and pelvis was  performed  following the standard protocol without intravenous contrast.  COMPARISON:  US ABDOMEN LIMITED RUQ/ASCITES dated3/16/2015; CT  ABD-PELV W/ CM dated 06/29/2013    FINDINGS:  Hepatomegaly and severe fatty infiltration of the liver again  identified. Spleen and pancreas are normal. Left adrenal gland is  normal. Right adrenal gland is not seen well. Kidneys are normal.    Aorta is not dilated. There is a nonobstructive bowel gas pattern.  Appendix is normal. Bladder is decompressed. Uterus and left ovary  appear normal. Right ovary not clearly identified. There is evidence  of a cystic structure in the right ovary measuring about 49 x 37 mm  similar to prior study. There is a small volume of free fluid in the  right adnexa into the cul-de-sac.  There are no acute musculoskeletal abnormalities. There is partially  visualized airspace opacity in the inferiorright middle lobe. This  has an appearance suggestive of atelectasis. The lungs are otherwise  clear.     IMPRESSION:  1. Stable hepatomegaly and severe fatty infiltration of the liver  2. Interval enlargement of a right adnexal cystic lesion to about 49  x 37 mm with development of small volume free fluid right adnexa and  cul-de-sac, suggesting rupture of a hemorrhagic cyst. The  persistence of this structure would suggest that pelvic ultrasound  should be considered to evaluate.      Electronically Signed    By: Esperanza Heir M.D.    On: 10/13/2013 17:42         Verified By: Otilio Carpen, M.D.,   Pertinent Past History:  Pertinent Past History -Chronic alcoholism, alcoholic liver disease, chronic thrombocytopenia and hepatic steatosis (on prior CT abdomen).  -COPD, smoker   Hospital Course:  Hospital Course 44 year old lady with h/o chronic alcoholism, alcoholic liver disease, chronic thrombocytopenia and hepatic steatosis (on prior CT abdomen).  Patient was admitted with diffuse abdominal pain,  jaundice, low grade fever and leukocytosis, suggestive of Spontaneous Bacterial Peritonitis. Abdominal u/s was negative for ascites, therefore paracentesis was cancelled. She received i.v. Zosyn. She remained afebrile. Blood c/s was  +GN cocco-bacilli in aerobic bottle only and urinalysis and urine c/s was negative. CT abdomen and pelvis revealed stable hepatomegaly, severe fatty infiltration, ? right adnexal hemorrhagic cyst. Pelvic u/s was done for right adnexal cyst, which revealed 4.3 cm simple right adnexal cyst. Patient had episodes of diarrhea during her stay likely due  to lactulose. Diarrhea resolved after lactulose was discontinued. Stool was negative for C. diff toxin A and B, negative C. diff PCR but + C. diff antigen. She does not have C. diff diarrhea but was placed in isolation as precautionary measure. Patient was followed by GI during her hospital stay. She declined participation in alcohol rehab at this time. She is not ready to quit alcohol use.  -COPD exacerbation: treated with i.v. Solumedrol, Duoneb rx, Budesonide rx, Spiriva inhaler and supplemental O2 prn. Smoking cessation counseling was provided. Patient is trying to quit.  -Reactive leukocytosis: secondary to steroid, WBC count is trending down. Shall discharge on Ciprofloxacin and Flagyl.  Exam:  AxOx3. NAD. HENT: + Icterus, no JVD, Chest clear to auscultation. CVS: RRR, S1S2. Abdomen: distended, soft, nontender, +BS. Ext: no ankle edema.   Condition on Discharge Stable   DISCHARGE INSTRUCTIONS HOME MEDS:  Medication Reconciliation: Patient's Home Medications at Discharge:     Physician's Instructions:  Diet Low Sodium   Activity Limitations As tolerated   Return to Work after follow up visit with MD   Time frame for Follow Up Appointment 1-2 weeks  Dr. Leotis Shames   Time frame for Follow Up Appointment 1-2 weeks  [facility]   Electronic Signatures: Leotis Shames (MD)  (Signed 24-Mar-15 10:20)  Authored: ADMISSION  DATE AND DIAGNOSIS, CHIEF COMPLAINT/HPI, Allergies, PERTINENT LABS, PERTINENT RADIOLOGY STUDIES, PERTINENT PAST HISTORY, HOSPITAL COURSE, DISCHARGE INSTRUCTIONS HOME MEDS, PATIENT INSTRUCTIONS   Last Updated: 24-Mar-15 10:20 by Leotis Shames (MD)

## 2014-11-19 NOTE — Discharge Summary (Signed)
Dates of Admission and Diagnosis:  Date of Admission 23-Nov-2013   Date of Discharge 02-Dec-2013   Admitting Diagnosis Acute alcoholic gastritis, alcoholic liver disease   Final Diagnosis Acute alcoholic gastritis, alcoholic liver disease, hepatic encephalopathy, reflux esophagitis, esophageal varices,   Discharge Diagnosis 1 Acute alcoholic gastritis, alcoholic liver disease, reflux esophagitis, esophageal varices    Chief Complaint/History of Present Illness 44 year old lady with h/o chronic alcoholism, alcoholic cirrhosis, chronic thrombocytopenia, alcohol related seizures and Bipolar disorder.  She presented with c/o abdominal pain, nausea and vomiting after recent alcohol use.   Allergies:  Codeine: Itching  Hydrocodone: Other  Hepatic:  28-Apr-15 09:22   Bilirubin, Total  3.4  Alkaline Phosphatase  184 (45-117 NOTE: New Reference Range 06/18/13)  SGPT (ALT) 32  SGOT (AST)  88  Total Protein, Serum  9.2  Albumin, Serum 4.2  29-Apr-15 03:55   Bilirubin, Total  3.2  Alkaline Phosphatase  148 (45-117 NOTE: New Reference Range 06/18/13)  SGPT (ALT) 33  SGOT (AST)  84  Total Protein, Serum 7.8  Albumin, Serum  3.2  30-Apr-15 05:28   Bilirubin, Total  2.1  Alkaline Phosphatase  126 (45-117 NOTE: New Reference Range 06/18/13)  SGPT (ALT) 34  SGOT (AST)  84  Total Protein, Serum 7.0  Albumin, Serum  3.0  02-May-15 05:34   Bilirubin, Total  1.4  Alkaline Phosphatase 115 (45-117 NOTE: New Reference Range 06/18/13)  SGPT (ALT) 22  SGOT (AST)  42  Total Protein, Serum  6.1  Albumin, Serum  2.6  03-May-15 05:47   Bilirubin, Total  1.5  Alkaline Phosphatase  127 (45-117 NOTE: New Reference Range 06/18/13)  SGPT (ALT) 24  SGOT (AST)  41  Total Protein, Serum 6.8  Albumin, Serum  3.0  Routine Micro:  28-Apr-15 18:53   Micro Text Report BLOOD CULTURE   COMMENT                   NO GROWTH AEROBICALLY/ANAEROBICALLY IN 5 DAYS   ANTIBIOTIC                        Micro Text Report BLOOD CULTURE   COMMENT                   NO GROWTH AEROBICALLY/ANAEROBICALLY IN 5 DAYS   ANTIBIOTIC                       Culture Comment NO GROWTH AEROBICALLY/ANAEROBICALLY IN 5 DAYS  Result(s) reported on 28 Nov 2013 at 06:00PM.  Culture Comment NO GROWTH AEROBICALLY/ANAEROBICALLY IN 5 DAYS  Result(s) reported on 28 Nov 2013 at 06:00PM.  Routine Chem:  28-Apr-15 16:14   Ethanol, S. < 3  Ethanol % (comp) < 0.003 (Result(s) reported on 23 Nov 2013 at 05:02PM.)  03-May-15 05:47   Ammonia, Plasma  61 (Result(s) reported on 28 Nov 2013 at 06:26AM.)  04-May-15 14:01   Ammonia, Plasma  54 (Result(s) reported on 29 Nov 2013 at 02:33PM.)  05-May-15 07:00   Ammonia, Plasma  49 (Result(s) reported on 30 Nov 2013 at 07:49AM.)  06-May-15 04:03   Ammonia, Plasma  57 (Result(s) reported on 01 Dec 2013 at 04:54AM.)  07-May-15 05:11   Ammonia, Plasma  55 (Result(s) reported on 02 Dec 2013 at 06:55AM.)  Glucose, Serum  100  BUN  6  Creatinine (comp) 0.80  Sodium, Serum 137  Potassium, Serum 3.7  Chloride, Serum 104  CO2, Serum  27  Calcium (Total), Serum 8.9  Anion Gap  6  Osmolality (calc) 272  eGFR (African American) >60  eGFR (Non-African American) >60 (eGFR values <54m/min/1.73 m2 may be an indication of chronic kidney disease (CKD). Calculated eGFR is useful in patients with stable renal function. The eGFR calculation will not be reliable in acutely ill patients when serum creatinine is changing rapidly. It is not useful in  patients on dialysis. The eGFR calculation may not be applicable to patients at the low and high extremes of body sizes, pregnant women, and vegetarians.)  Routine Hem:  07-May-15 05:11   WBC (CBC) 9.5  RBC (CBC)  3.45  Hemoglobin (CBC)  11.5  Hematocrit (CBC) 35.4  Platelet Count (CBC)  149  MCV  103  MCH 33.3  MCHC 32.4  RDW  15.5  Neutrophil % 50.4  Lymphocyte % 27.5  Monocyte % 19.8  Eosinophil % 1.7  Basophil % 0.6   Neutrophil # 4.8  Lymphocyte # 2.6  Monocyte #  1.9  Eosinophil # 0.2  Basophil # 0.1 (Result(s) reported on 02 Dec 2013 at 05:49AM.)   PERTINENT RADIOLOGY STUDIES: XRay:    28-Apr-15 11:18, Chest Portable Single View  Chest Portable Single View   REASON FOR EXAM:    cough, wbc  COMMENTS:       PROCEDURE: DXR - DXR PORTABLE CHEST SINGLE VIEW  - Nov 23 2013 11:18AM     CLINICAL DATA:  Cough.  Nausea and vomiting.    EXAM:  PORTABLE CHEST - 1 VIEW    COMPARISON:  Radiographs dated 10/13/2013 and08/01/2013    FINDINGS:  Heart size and pulmonary vascularity are normal and the lungs are  clear. No osseous abnormality.   IMPRESSION:  Normal exam.      Electronically Signed    By: JRozetta NunneryM.D.    On: 11/23/2013 11:23         Verified By: JLarey Seat M.D.,  CT:    28-Apr-15 13:22, CT Abdomen and Pelvis With Contrast  CT Abdomen and Pelvis With Contrast   REASON FOR EXAM:    (1) abd pain, n/v; (2) abd pain, n/v  COMMENTS:       PROCEDURE: CT  - CT ABDOMEN / PELVIS  W  - Nov 23 2013  1:22PM     CLINICAL DATA:  Constipation and vomiting for 3 days.    EXAM:  CT ABDOMEN AND PELVIS WITH CONTRAST    TECHNIQUE:  Multidetector CT imaging of the abdomen and pelvis was performed  using the standard protocol following bolus administration of  intravenous contrast.  CONTRAST:  100 cc Isovue 300.    COMPARISON:  CT abdomen and pelvis 10/13/2013. Pelvic ultrasound  10/14/2013.    FINDINGS:  The lung bases are clear. No pleural or pericardial effusion. Small  hiatal hernia is noted.    The liver is markedly heterogeneous with a nodular border consistent  with cirrhosis. The umbilical vein is recanalized. Nofocal liver  lesion is identified. The splenic and portal veins are patent. The  adrenal glands, spleen, pancreas and kidneys appear normal. Small  large bowel and appendix appear normal. A 3.5 x 3.6 cm cystic lesion  is seen the right adnexa and wasshown to  be a simple cyst on the  prior pelvic ultrasound. Uterus and left ovary are unremarkable.  Urinary bladder appears normal. No lymphadenopathy or fluid is  identified No focal bony abnormality is seen.     IMPRESSION:  No  acute finding.    Cirrhotic appearing liver.      Electronically Signed    By: Inge Rise M.D.    On: 11/23/2013 13:41       Verified By: Ramond Dial, M.D.,   Pertinent Past History:  Pertinent Past History alcoholic cirrhosis alcohol related seizures bipolar disorder COPD chronic back pain   Hospital Course:  Hospital Course Patient was admitted for acute alcoholic gastritis, abdominal pain, nausea and vomiting improved with Protonix. Patient was noted to have elevated ammonia level with lethargy and altered mental status consistent with hepatic encephalopathy. Patient received lactulose with improvement in ammonia levels. GI was consulted. EGD revealed LA Grade C reflux esophagitis and grade 1 esophageal varices. Chronic back pain was controlled with Oxycontin. Spiriva inhaler and Duoneb treatments were continued for COPD   Condition on Discharge Stable   DISCHARGE INSTRUCTIONS HOME MEDS:  Medication Reconciliation: Patient's Home Medications at Discharge:     Medication Instructions  seroquel xr 150 mg oral tablet, extended release  1 tab(s) orally once a day   keppra 750 mg oral tablet  558m in the morningin the evening   clonazepam 1 mg oral tablet  1 tab(s) orally 2 times a day   lyrica 75 mg oral capsule  1 cap(s) orally 2 times a day   tiotropium 18 mcg inhalation capsule  1 cap(s) inhaled once a day   albuterol-ipratropium 2.5 mg-0.5 mg/3 ml inhalation solution   inhaled every 6 hours as needed   multivitamin  1 tab(s) orally once a day   spironolactone 25 mg oral tablet  1 tab(s) orally once a day   fentanyl  25 microgram(s) topically every 3 days   lactulose 10 g/15 ml oral syrup  30 milliliter(s) orally every 8 hours    pantoprazole 40 mg oral delayed release tablet  1 tab(s) orally 2 times a day   thiamine 100 mg oral tablet  1 tab(s) orally once a day    PRESCRIPTIONS: ELECTRONICALLY SUBMITTED  STOP TAKING THE FOLLOWING MEDICATION(S):    oxycontin 15 mg oral tablet, extended release: 1 tab(s) orally every 12 hours ibuprofen 200 mg oral tablet: 2 tab(s) orally 2 times a day, As Needed  Physician's Instructions:  Diet Low Sodium  1 gram sodium   Activity Limitations As tolerated   Return to Work Not Applicable   Time frame for Follow Up Appointment 1-2 weeks  [facility]   Time frame for Follow Up Appointment 1-2 weeks  Dr. SCandiss Norse  Electronic Signatures: SGlendon Axe(MD)  (Signed 07-May-15 17:03)  Authored: ADMISSION DATE AND DIAGNOSIS, CHIEF COMPLAINT/HPI, Allergies, PERTINENT LABS, PERTINENT RADIOLOGY STUDIES, PERTINENT PAST HISTORY, HOSPITAL COURSE, [facility], PATIENT INSTRUCTIONS   Last Updated: 07-May-15 17:03 by SGlendon Axe(MD)

## 2014-11-19 NOTE — Consult Note (Signed)
CC: abd pain.  Pt TB going up to 9, other LFT's up.  Abd becoming more distended.  US was neg for ascites. CT ordered and patient drinking contrast.  She is a difficult historian and is unwilling or unable to provide any details about the extent of her symptoms or bowel movements.  WBC still elevated up to 24.3, Afebrile, blood cult neg.  On Zosyn for fever and leukocytosis in a cirrhotic.  Chest exam shows wheezes so I told the nurse to get her a breathing treatment.  This should be ordered qid for next few days as she has signif exp wheezes.  need Hepatitis A<B>C if not done.  Electronic Signatures: Scot JunElliott, Robert T (MD)  (Signed on 18-Mar-15 13:56)  Authored  Last Updated: 18-Mar-15 13:56 by Scot JunElliott, Robert T (MD)

## 2014-11-19 NOTE — Consult Note (Signed)
Pt CC is leukocytosis, possible sepsis.  WBC coming down on Zosyn, TB stable at 9.  Discussed alcohol with her, she blames her husband for drinking in front of her.  US noted.  No new suggestions. Hep ABC neg.  AMA neg.  No new suggestions.  Electronic Signatures: Scot JunElliott, Robert T (MD)  (Signed on 19-Mar-15 18:13)  Authored  Last Updated: 19-Mar-15 18:13 by Scot JunElliott, Robert T (MD)

## 2014-11-19 NOTE — Consult Note (Signed)
Pt requesting a note so that medicaid or medicare can pay for someone to come in and clean her house and tend to her. I told her it would be up to her primary doctor.   Pt feels better today, breathing better.  Abd less tender.  TB down, WBC still 26.  chest without wheezing at this time.C. diff neg for PCR but positive for antigen, neg for toxins.  I doubt she has clinical C. diff infection but to be on safe side I agee with isolation.  No new suggestions.  Suggest arrange alcohol rehab for after discharge.  Electronic Signatures: Scot JunElliott, Jelene Albano T (MD)  (Signed on 23-Mar-15 18:09)  Authored  Last Updated: 23-Mar-15 18:09 by Scot JunElliott, Amarien Carne T (MD)

## 2014-11-19 NOTE — Op Note (Signed)
PATIENT NAME:  Crystal [name], Crystal [name] MR#:  [id number] DATE OF BIRTH:  27-Apr-1971  DATE OF PROCEDURE:  10/11/2013  PREOPERATIVE DIAGNOSES:  1.  Alcoholic liver disease.  2.  Spontaneous bacterial peritonitis.  3.  Chronic obstructive pulmonary disease.  4.  Poor venous access.   POSTOPERATIVE DIAGNOSES:  1.  Alcoholic liver disease.  2.  Spontaneous bacterial peritonitis.  3.  Chronic obstructive pulmonary disease.  4.  Poor venous access.   PROCEDURES:  1. Ultrasound guidance for vascular access to right basilic vein.  2. Fluoroscopic guidance for placement of catheter.  3. Insertion of peripherally inserted central venous catheter, double-lumen, right arm.  SURGEON: Annice NeedyJason S. Ashani Pumphrey, M.D.   ANESTHESIA: Local.   ESTIMATED BLOOD LOSS: Minimal.   INDICATION FOR PROCEDURE: A 44 year old female with severe alcoholic liver disease, likely spontaneous bacterial peritonitis. She has multiple other ongoing issues and needs durable venous access.   DESCRIPTION OF PROCEDURE: The patient's right arm was sterilely prepped and draped, and a sterile surgical field was created. The right basilic vein was accessed under direct ultrasound guidance without difficulty with a micropuncture needle and permanent image was recorded. 0.018 wire was then placed into the superior vena cava. Peel-away sheath was placed over the wire. A single lumen peripherally inserted central venous catheter was then placed over the wire and the wire and peel-away sheath were removed. The catheter tip was placed into the superior vena cava and was secured at the skin at 30 cm with a sterile dressing. The catheter withdrew blood well and flushed easily with heparinized saline. The patient tolerated procedure well.  ____________________________ Annice NeedyJason S. Maranda Marte, MD jsd:gb D: 10/13/2013 15:13:00 ET T: 10/13/2013 21:31:14 ET JOB#: [id number]  cc: Annice NeedyJason S. Gean Larose, MD, <Dictator> Annice NeedyJASON S Quaran Kedzierski MD ELECTRONICALLY SIGNED 11/01/2013 9:43

## 2014-11-19 NOTE — Consult Note (Signed)
Pt CC is liver disease due to alcoholic cirrhosis.  She has wheezing on exam with signif exp wheezing in both sides.  TB down some, pt WBC up but she is on steroids.  Continue Zosyn.  No new treatment recommended.  Dr. Servando SnareWohl is on call this weekend but will not see unless you call with a problem.  Electronic Signatures: Scot JunElliott, Jillane Po T (MD)  (Signed on 20-Mar-15 12:44)  Authored  Last Updated: 20-Mar-15 12:44 by Scot JunElliott, Kyasia Steuck T (MD)

## 2014-11-19 NOTE — H&P (Signed)
PATIENT NAME:  Crystal [name], Crystal [name] MR#:  [id number] DATE OF BIRTH:  12/28/1970  DATE OF ADMISSION:  11/23/2013  PRIMARY CARE PHYSICIAN: Leotis Shames, MD  CHIEF COMPLAINT: Abdominal pain, nausea, vomiting.   HISTORY OF PRESENT ILLNESS: This is a 44 year old Caucasian female patient with polysubstance abuse, chronic alcoholism, alcohol-related seizures, anorexia, seizures, bipolar, presents to the Emergency Room complaining of 3 days of nausea, vomiting, abdominal pain. The patient mentions that she has chronic abdominal pain, but this is significantly worse. Unable to keep any pain medications down because of nausea, vomiting and presented to the Emergency Room. Here, the patient has been found to have tachycardia up to 120s, along with white count elevated at 19,000, uncontrollable nausea, vomiting in spite of treatment, and also intractable pain and is being admitted to the hospitalist service.   The patient was last in the hospital admitted in March 2015 for SBP, treated with antibiotics, and discharged home in a stable condition.   PAST MEDICAL HISTORY: 1.  SBP. 2.  Chronic alcoholism. 3.  Genital herpes. 4.  ADHD. 5.  Bipolar disorder.  6.  History of bulimia, anorexia in the past.  7.  Polysubstance abuse.  8.  Alcohol-related seizures.  9.  Questionable cirrhosis.   HOME MEDICATIONS:  1.  Seroquel XR 150 mg daily. 2.  Lyrica 75 mg 2 times a day. 3.  Keppra 500 mg 2 times a day. 4.  Gabapentin 300 mg b.i.d.  5.  Lorazepam 1 mg 2 times a day.  6.  Acetaminophen/oxycodone 325/5, 1 tablet every 12 hours as needed.   ALLERGIES: CODEINE, HYDROCODONE.   FAMILY HISTORY: Polysubstance dependence.   SOCIAL HISTORY: The patient is currently married, lives with her husband. She drinks a fifth of vodka every day but states that her last drink was on Saturday, 3 days prior. Continues to smoke a pack a day. She mentions that she has not done any illicit drugs in years.   REVIEW OF SYSTEMS:     CONSTITUTIONAL: Complains of fatigue, weakness.  EYES: No blurred vision, pain, redness. ENT: No tinnitus, ear pain, hearing loss.  RESPIRATORY: No cough, wheeze, hemoptysis.  CARDIOVASCULAR: No chest pain, orthopnea, edema.  GASTROINTESTINAL: Has nausea, vomiting, abdominal pain, and constipation.  GENITOURINARY: No dysuria, hematuria, or frequency.  ENDOCRINE: No polyuria, nocturia, or thyroid problems. HEMATOLOGIC AND LYMPHATIC: Has chronic anemia.  INTEGUMENTARY: No acne, rash, lesion.  MUSCULOSKELETAL: Has back pain, arthritis.  NEUROLOGIC: No focal numbness, weakness. Has history of seizure.  PSYCHIATRIC: Has bipolar disorder, anxiety.   PHYSICAL EXAMINATION: VITAL SIGNS: Temperature 98.5, pulse of 121, respirations 22, blood pressure 134/79, saturating 96% on room air.  GENERAL: Moderately built Caucasian female patient lying in bed, restless, in significant distress secondary to the abdominal pain.  PSYCHIATRIC: Alert and oriented x 3, anxious, tearful.  HEENT: Atraumatic, normocephalic. Oral mucosa dry and pink. Pallor positive. No icterus. Pupils bilaterally equal and reactive to light.  NECK: Supple. No thyromegaly or palpable lymph nodes. Trachea midline. No carotid bruit or JVD. CARDIOVASCULAR: S1, S2, tachycardic, without any murmurs. Peripheral pulses 2+. No edema.  RESPIRATORY: Normal work of breathing. Clear to auscultation both sides. GASTROINTESTINAL: Soft abdomen. Tenderness diffusely. No distention. No scars. Has some papules on the skin of the belly. No hepatosplenomegaly palpable.  GENITOURINARY: No CVA tenderness or bladder distention.  SKIN: Warm and dry. No petechiae, rash, ulcer.  MUSCULOSKELETAL: No joint swelling, redness of the large joints. Normal muscle tone.  NEUROLOGICAL: Motor strength 5/5 in upper and  lower extremities. Sensation to fine touch intact all over.  LYMPHATIC: No cervical lymphadenopathy.   LABORATORY, DIAGNOSTIC, AND RADIOLOGICAL DATA:  Urine pregnancy test negative.   Glucose 117, BUN 18, creatinine 0.52, sodium 138, potassium 3.9, chloride 100, GFR greater than 60. AST, ALT of 32, 88; alkaline phosphatase of 184 with bilirubin of 3.4.   WBC 19.7, hemoglobin 13.6, platelets of 145. MCV of 103.   Urinalysis shows trace bacteria but only 4 WBCs with 6 epithelial cells.   CT scan of the abdomen and pelvis with contrast shows liver cirrhosis. No other acute findings. No ascites.   Chest x-ray shows nothing acute.   Ultrasound of the right upper quadrant shows fatty infiltration of the liver. No cholecystitis or gallstones found.   EKG shows normal sinus rhythm. No acute ST-T wave changes.   ASSESSMENT AND PLAN: 1.  Abdominal pain, nausea, vomiting, likely secondary from gastritis from chronic alcohol use. The patient does have elevated bilirubin of 3.4, but the ultrasound is normal. This is secondary to cirrhosis and has chronic hyperbilirubinemia due to that. Will treat her with p.r.n. pain medications. Continue home OxyContin. Put her on IV Protonix. The patient will be presently n.p.o. except medications. Can start her on some diet tomorrow once she is feeling better. Lipase is normal. CT scan of the abdomen shows nothing acute. Will check alcohol level and urine drug screen in this patient who has polysubstance abuse and alcoholism.  2.  Hypokalemia secondary to vomiting, decreased intake. Will replace oral and IV.  3.  Leukocytosis. The patient has had persistent leukocytosis reviewing old records. Her last leukocyte count on 10/19/2013 was 24.4. Today, leukocytosis seems better. Will get blood cultures, as she does have tachycardia and also leukocytosis, to make sure she does not have any infection, but I would not treat her with any antibiotics at this time. The patient is afebrile.  4.  Alcohol abuse and withdrawal. The patient likely has withdrawal causing the mild tremors and tachycardia the patient has. Will put her on  CIWA protocol.  5.  Tobacco abuse. Counseled the patient to quit smoking for greater than 3 minutes.  6.  Deep vein thrombosis prophylaxis with Lovenox.   CODE STATUS: Full code.   TIME SPENT: Today on this case was 45 minutes.   ____________________________ Molinda BailiffSrikar R. Kanitra Purifoy, MD srs:jcm D: 11/23/2013 16:46:50 ET T: 11/23/2013 17:03:08 ET JOB#: [id number]  cc: Wardell HeathSrikar R. Aquarius Latouche, MD, <Dictator> Leotis ShamesJasmine Singh, MD Orie FishermanSRIKAR R Wells Gerdeman MD ELECTRONICALLY SIGNED 12/24/2013 14:11

## 2014-11-19 NOTE — Consult Note (Signed)
PATIENT NAME:  Crystal [name], Crystal [name] MR#:  [id number] DATE OF BIRTH:  07-19-1971  DATE OF CONSULTATION:  11/25/2013  REFERRING PHYSICIAN:   CONSULTING PHYSICIAN:  Audery AmelJohn T. Clapacs, MD  IDENTIFYING INFORMATION AND REASON FOR CONSULTATION:  This is a 44 year old woman with a history of long-standing alcohol dependence and multiple medical problems as a result.  Consultation for substance abuse and detox.   HISTORY OF PRESENT ILLNESS:  Information obtained from the patient and the chart.  The patient presented to the hospital on the 28th with a history of a couple of days of acute abdominal and back pain with nausea and vomiting.  She was admitted to the hospital for evaluation and treatment.  The patient gives a history to me that she had been sober for several weeks prior to this Sunday when a friend came around wanting to celebrate with some alcohol.  The patient admits that she relapsed and drank some liquor on Sunday and a little bit on Monday.  She almost immediately developed her abdominal pain again like we are seeing now.  Currently, she states she feels a little bit shaky, but she denies that she has been having any auditory or visual hallucinations.  She states that her mood had been reasonably okay prior to relapse.  Does not report any suicidal or homicidal ideation.  Does not report that she had been severely depressed.  She denies that she has been abusing any other drugs.   PAST PSYCHIATRIC HISTORY:  Long history of alcohol dependence.  Multiple episodes of detox.  Multiple episodes of rehab treatment and alcohol and drug abuse treatment center and other facilities.  She states that she has had times of being able to stay sober for months at a time.  She suggests that at least one of them might have been when she was locked up.  Recently it sounds like it has been weeks at a time at most.  She denies any history of suicide attempts.  Denies any history of violence.  Denies any history of psychotic  illnesses.   FAMILY HISTORY:  Denies any family history.   SOCIAL HISTORY:  The patient reports that she is married to a man who is a couple of decades older than her who works at [employer].  She says that they have a happy life at home.  He does not drink heavily.  He is not abusive of her.  She does not work outside the home.  When she is well she mostly piddles around the house.   PAST MEDICAL HISTORY:  The patient has a history of alcoholic hepatitis, probably cirrhosis.  Past history of a diagnosis of bipolar at one point and bulimia, although these are questionable.  History of genital herpes.   CURRENT MEDICATIONS:  She is not the most reliable historian.  According to the intake note she reported that she was taking Seroquel XR 150 mg a day, Lyrica 75 mg twice a day, Keppra 500 mg twice a day, gabapentin 300 mg twice a day, lorazepam 1 mg twice a day.  Acetaminophen oxycodone 5 mg once every 12 hours as needed.   ALLERGIES:  CODEINE AND HYDROCODONE.   REVIEW OF SYSTEMS:  Currently complains of abdominal and back pain.  She is currently trying to eat some food and has not been throwing up today, although she was feeling a little sick earlier.  While I was talking with her she was eating some fruit and reporting that she was feeling like  it was getting stuck in her throat.  She denies auditory or visual hallucinations.  Denies suicidal or homicidal ideation.  Does feel weak and a little bit shaky.  The rest of the review of systems is negative.   MENTAL STATUS EXAMINATION:  Disheveled woman, looks older than her stated age, cooperative with the interview.  Eye contact intermittent.  Psychomotor activity a little bit unsteady and discoordinated.  Speech is slow, but easy to understand.  Affect is a little bit blunted.  Mood is stated as okay.  Thoughts are slow, but lucid.  No obvious delusional thinking.  She does get mixed up a few times in her history, sometimes wandering off topic or mumbling in a  way that is a little incoherent.  Denies suicidal or homicidal ideation.  She is alert and oriented, although she is a little slow getting her thoughts together.  She can remember three out of three objects at two minutes.  Judgment and insight, some chronic impairment given her ongoing substance abuse.  Longer term memory, a little shaky.  Normal fund of knowledge.  Normal intelligence.   VITAL SIGNS:  Currently has a temperature of 100.3, last pulse taken was 120, blood pressure 120/64.   ASSESSMENT:  This is a 44 year old woman with alcohol dependence, multiple medical problems as a result including thrombocytopenia, hepatic liver disease, probably cirrhosis, possible pancreatitis.  She is well aware of the connection between her drinking and her medical problems.  Just in case, I reminded her that her drinking at this point was a life and death decision, that continued drinking was putting her at risk of death from cirrhosis or other causes.  The patient is not psychotic and not suicidal.  At this point, she is being treated for withdrawal on the medical unit.  She has a questionable history of seizures.  The history is not all that clear, but she probably has had an alcohol withdrawal seizure in the past, but at this point is not in the window to have one.  She still is at some risk of developing delirium tremens.  The patient is not interested in going to long-term substance abuse treatment.   TREATMENT PLAN:  Continue treating alcohol withdrawal as well as treating her abdominal pain and other medical issues.  I will follow up with her.  Monitor for the progression of her delirium.  If she gets much worse, I would greatly increase the amount of Ativan that she is getting.  At this point, she is not a candidate for transfer to psychiatry and given that her primary problem is alcohol withdrawal, probably does not need to come to psychiatry anyway.  I have counseled her about her alcohol use, but she  declines the suggestion of going to longer term treatment.  She will be given information about treatment services available in the community and more education about alcohol abuse prior to discharge.   DIAGNOSIS, PRINCIPAL AND PRIMARY:  AXIS I:  Alcohol dependence.   SECONDARY DIAGNOSES: AXIS I:  Delirium, secondary to alcohol withdrawal.  AXIS II:  Deferred.  AXIS III:  Alcohol withdrawal.  Rule out pancreatitis.  Possible cirrhosis.  AXIS IV:  Severe from ongoing health problems.  AXIS V:  Functioning at time of evaluation is 35.    ____________________________ Audery Amel, MD jtc:ea D: 11/26/2013 00:02:35 ET T: 11/26/2013 00:22:55 ET JOB#: [id number]  cc: Audery Amel, MD, <Dictator> Audery Amel MD ELECTRONICALLY SIGNED 11/27/2013 16:07

## 2014-11-19 NOTE — Consult Note (Signed)
PATIENT NAME:  Crystal [name], DIAS MR#:  [id number] DATE OF BIRTH:  04-14-1971  DATE OF CONSULTATION:  12/02/2013  REFERRING PHYSICIAN:  Glendon Axe, MD CONSULTING PHYSICIAN:  Arther Dames, MD  REASON FOR CONSULTATION: Cirrhosis.   HISTORY OF PRESENT ILLNESS: Crystal [name] is a 44 year old female with cirrhosis due to EtOH complicated by esophageal varices, who was admitted to the hospital on April 28th for abdominal pain, nausea and vomiting. GI is currently consulted for management of her cirrhosis.   I do follow Crystal [name] in the GI liver clinic. She did have an upper endoscopy during this hospitalization that showed possible grade 1 varices and portal hypertensive gastropathy, along with esophagitis.   Currently, she is overall doing well. She feels as though her abdominal pain, nausea and vomiting have all improved significantly. She is not reporting any confusion. She is on lactulose 30 mL q.6 and is moving her bowels at most once per day. She denies any rectal bleeding or melena. She would actually like to go home at this time.   PAST MEDICAL HISTORY: 1.  Alcohol cirrhosis.  2.  Chronic alcoholism.  3.  Bipolar disorder.  4.  Polysubstance abuse.  5.  Alcohol-related seizures.   HOME MEDICATIONS:  Seroquel, Lyrica, Keppra, gabapentin, lorazepam, acetaminophen.   ALLERGIES: CODEINE AND HYDROCODONE.   FAMILY HISTORY: No liver disease or GI malignancy that she is aware of.   SOCIAL HISTORY: She was previously a very heavy alcohol drinker. She reports that she quit approximately 1 month ago. She continues to smoke.   REVIEW OF SYSTEMS: A 10 system review was conducted. It is negative except as stated in the HPI.   PHYSICAL EXAMINATION: VITAL SIGNS: Temperature is 99.0. Pulse is 105. Respirations are 20. Blood pressure is 98/70. Pulse ox is 94% on room air.  GENERAL: Alert and oriented times 4.  No acute distress. Appears stated age. HEENT: Normocephalic/atraumatic. Extraocular  movements are intact. Anicteric. NECK: Soft, supple. JVP appears normal. No adenopathy. CHEST: Clear to auscultation. No wheeze or crackle. Respirations unlabored. HEART: Regular. No murmur, rub, or gallop.  Normal S1 and S2. ABDOMEN: Soft, nontender.  Positive for abdominal distension. Normoactive bowel sounds in all 4 quadrants.  No organomegaly. No masses. EXTREMITIES: No swelling, well perfused. SKIN: No rash or lesion. Skin color, texture, turgor normal. NEUROLOGICAL: Grossly intact. PSYCHIATRIC: Normal tone and affect. MUSCULOSKELETAL: No joint swelling or erythema.   LABORATORY DATA: Sodium 137, potassium 3.7, creatinine 0.8, BUN 6, ammonia 55, albumin 3, T-bili 1.5, alk phos 127, AST 41, ALT 24. White count is 9.5, hemoglobin 11.5, hematocrit 35. Platelets are 103. She did have some imaging. She had a CT scan on April 28th that showed cirrhotic-appearing liver but no abdominal ascites or other explanation for her abdominal pain. She also had an ultrasound of her liver on the 28th that showed hepatomegaly, but no other abnormalities on that study.   ASSESSMENT AND PLAN: 1.  Alcohol cirrhosis: Fortunately, she seems to be mostly well compensated at this time. She does not have any evidence of ascites on her imaging studies. She does have some possible very mild varices on upper endoscopy. I do not see any evidence of hepatic encephalopathy at this time. Although her ammonia level is slightly elevated, that is not a reliable clinical indicator. I would go by her clinical status and not her ammonia level.   RECOMMENDATIONS:  It would be reasonable to continue her on lactulose at 30 mL q.6 hours as an outpatient. She  needs to titrate this to 2 bowel movements per day. She should continue on a low sodium diet and with alcohol avoidance.   I will schedule a followup with her in my clinic in about 2 weeks to see how she is doing.   Thank you for this  consult.   ____________________________ Arther Dames, MD mr:dmm D: 12/02/2013 17:34:23 ET T: 12/02/2013 21:18:35 ET JOB#: [id number]  cc: Arther Dames, MD, <Dictator> Mellody Life MD ELECTRONICALLY SIGNED 12/18/2013 19:14

## 2014-11-19 NOTE — Consult Note (Signed)
PATIENT NAME:  Crystal [name], Crystal [name] DATE OF BIRTH:  [birth year]  DATE OF CONSULTATION:  10/12/2013  REFERRING PHYSICIAN:   CONSULTING PHYSICIAN:  Scot Junobert T. Kamaree Berkel, MD  HISTORY OF PRESENT ILLNESS:  The patient is a 44 year old white female with a history of alcohol liver disease, chronic thrombocytopenia and fatty liver who presented with abdominal pain and fever as well as wheezing, shortness of breath and coughing. I was asked to see her because of elevated liver function tests and jaundice with a total bilirubin of 7.5.   The patient says she has had elevated temperature for a few days and with fever up to 100.4   She was evaluated while admitted to the hospital in December 2014 for abdominal pain and it showed hepatomegaly with hepatic steatosis. She does also have chronic back pain and had epidural injections.   She has a history of alcohol abuse, continues to drink vodka daily and also to smoke cigarettes. She has a history of probable seizure activity and is currently taking Keppra for this.   PAST MEDICAL HISTORY:  1.  Brain aneurysm.  2.  Polysubstance abuse.  3.  History of bulimia and anorexia in the past.  4.  Bipolar disorder with previous suicide attempt in the past.  5.  Hepatic steatosis.  6.  Chronic thrombocytopenia.  7.  Alcohol liver disease.  8.  Alcohol-related seizures.  9.  Chronic alcoholism.   ALLERGIES: HYDROCODONE CAUSES ITCHING.   CURRENT MEDICATIONS:  Keppra 500 milligrams every morning, 750 mg every evening, Lyrica 75 milligrams twice a day, albuterol nebulizer 4 times a day as needed, Milk of Magnesia daily, Seroquel XR 150 milligrams a day at bedtime, Klonopin 1 milligrams twice a day, Nexium 40 milligrams a day, Phenergan 25 milligrams three times a day as needed, spironolactone 25 milligrams a day started on 03/16.   SOCIAL HISTORY: Smokes a pack a day and drinks vodka daily.   FAMILY HISTORY: Positive for alcohol abuse with liver disease in  her aunt and a grandfather.   When I first went into the patient's room, she had no clothes on from the waist up. I walked out and told the nurse that the patient had to gown, otherwise I would not examine her.  She then put on gown for the exam.   PHYSICAL EXAMINATION:  GENERAL: Shows a white female sitting up in bed in no acute distress.  HEENT: Sclerae are icteric. Conjunctivae negative.  The head is atraumatic. She was getting a breathing treatment at this time. She had some bilateral wheezing which actually improved during the time of the breathing treatment.  ABDOMEN: Showed tenderness over the right upper quadrant and there was some mild distention. She did have spider angiomas on her trunk and upper extremities and jaundice tint to her skin.   LABS:  Show lipase 84, LDH 247, amylase 16, ammonia of 22, total bilirubin is 7.7, alkaline phosphatase 332, SGPT 48, SGOT of 159, albumin 2.3, glucose 86, BUN 5, creatinine 0.39, sodium 135, potassium 3.9, chloride 103, CO2 26, calcium 8.6. White count 21.1, hemoglobin 11.1, hematocrit 33.2, platelet count 224,000. Ultrasound of the abdomen showed no ascites identified in the peritoneal cavity. It was a limited ultrasound of 4 quadrants to look for ascites.   ASSESSMENT: Fever, jaundice, elevated white count and possibility of spontaneous bacterial peritonitis even in the absence of significant ascites. Given her history of alcoholism and jaundice it is possible she could have alcohol hepatitis producing fever, elevated  white count and jaundice. She has not had a urinalysis yet. I will order a urinalysis and a C and S.  Because of her reported fever and elevated white count, I will go ahead and start empiric antibiotics with Zosyn. I will follow with you.    ____________________________ Scot Jun, MD rte:mk D: 10/12/2013 20:45:22 ET T: 10/12/2013 21:15:15 ET JOB#: [id number]  cc: Scot Jun, MD, <Dictator> Leotis Shames, MD Scot Jun MD ELECTRONICALLY SIGNED 10/22/2013 18:29

## 2014-11-27 NOTE — Discharge Summary (Signed)
PATIENT NAME:  Crystal [name], Crystal [name] MR#:  [id number] DATE OF BIRTH:  [birth year]  DATE OF ADMISSION:  08/15/2014 DATE OF DISCHARGE:  08/19/2014  DIAGNOSES AT TIME OF DISCHARGE: 1. Alcoholic intoxication with delirium tremens.  2. Melena, most likely secondary to upper gastrointestinal bleed.  3. Cirrhosis of liver.  4. Seizure disorder.  5. Chronic obstructive pulmonary disease.  6. Malnutrition.  7. Elevated ammonia levels.   CHIEF COMPLAINT: Alcoholic intoxication with vomiting and dark tarry stools.   HISTORY OF PRESENT ILLNESS: Crystal [name] is a 44 year old female, who presented to ED complaining of vomiting for 4 days duration. The patient also has been complaining of dark, tarry, emesis and dark, tarry stool and also noticed frank blood in the urine. She states that she is not able to keep any beverages including alcoholic beverages for the last few days and her last drink had been approximately 24 hours prior to admission. The patient was noted to have an elevated ethanol level, as well as Hemoccult positive stools and was admitted for this reason. Please see H and P for full details.   PHYSICAL EXAMINATION:  VITAL SIGNS: Temperature 98.2, pulse was 106, respirations 13, blood pressure 111/78, pulse oximetry 96% on room air.  GENERAL: She was alert and oriented initially, but subsequently was noted to be somnolent.   HOSPITAL COURSE:  She was seen in consultation by psychiatrist, as well as by GI and she was placed on CIWA protocol and appeared to receive sedation form this. Benzodiazepines were gradually weaned during her stay in the hospital. The patient was also seen by gastroenterologist, Dr. Mechele CollinElliott, and felt that she was too sleepy from her Ativan and no further invasive procedures were done. During her stay in the hospital, the patient remained stable. The patient's hemoglobin initially was 14.4 and remained stable during her stay and was 12.4 on 08/18/2014. She was noted to have an  elevated ammonia level as well, and she was also noted to have low albumin. Her potassium was repleted. The patient, however, left AGAINST MEDICAL ADVICE on the 22nd of January and was advised to follow with me, Dr. Marcello FennelHande, in 1 to 2 weeks' time. She was advised to continue her home medications including Keppra 5 mg a day a.m. and 750 in the p.m., spironolactone 25 mg a day, thiamine 100 mg once a day, Seroquel XR 150 once a day, multivitamin 1 tablet a day, lansoprazole 15 mg once a day, gabapentin 600 mg p.o. t.i.d., clonazepam 1 mg p.o. b.i.d. and albuterol with ipratropium nebulizer q.6 p.[name].n.   The patient was strongly advised to quit alcohol completely and to follow with me, Dr. Marcello FennelHande, in 1 to 2 weeks' time.   TOTAL TIME SPENT IN DISCHARGING THE PATIENT: 35 minutes.  ____________________________ Barbette ReichmannVishwanath Orilla Templeman, MD vh:ap D: 09/05/2014 13:15:00 ET T: 09/05/2014 16:27:16 ET JOB#: [id number]  cc: Barbette ReichmannVishwanath Doaa Kendzierski, MD, <Dictator> Barbette ReichmannVISHWANATH Lonnie Rosado MD ELECTRONICALLY SIGNED 09/21/2014 13:16

## 2014-11-27 NOTE — Consult Note (Signed)
PATIENT NAME:  Crystal [name], Crystal [name] MR#:  [id number] DATE OF BIRTH:  November 13, 1970  DATE OF CONSULTATION:  10/23/2014  REFERRING PHYSICIAN:   CONSULTING PHYSICIAN:  Crystal [name] K. Crystal Casa, MD  SUBJECTIVE: The patient was seen in consultation in [facility]. 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 The patient married for 1-1/2 years and lives with her husband, who is 42 years old and works on [employer]. [facility] after an IVC was initiated by her mother, who always did the same. Mother called to tell that father died, and father is in [id number]. The patient reports that she did not feel suicidal but she felt hopeless and helpless because of father's death. According to information obtained from the chart, the patient's BAL was 347. The patient also is an alcoholic of many years. 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 However, she cut down her drinking and currently drinks 4 bootleggers per day and last drink was just prior to her hospitalization.  PAST PSYCHIATRIC HISTORY: No history of inpatient hold on psychiatry but did have a long history of alcohol dependence but does not want to take any help, though she has cirrhosis of liver and problems with pancreas. The patient reports that she is being [facility] and she is sometimes noncompliant with medications but plans to keep up with followup appointments.  ALCOHOL AND DRUGS: Alcohol as stated above. Denies any other street or prescription drug abuse.  Does smoke nicotine cigarettes.   MENTAL STATUS EXAMINATION: Patient is alert and oriented, calm, pleasant, and cooperative, no agitation. Affect neutral. Mood stable. Denies feeling depressed. Denies feeling hopeless or helpless. No psychosis. Does not appear to respond to internal  stimuli. Cognition intact. Denies suicidal or homicidal plans. Contracts for safety. Eager to go home to be with her husband and deal with her father's death and keep up her followup [facility].   IMPRESSION: Alcohol dependence, chronic, continuous, with medical problems related to alcohol which happens to be cirrhosis of the liver.  PLAN: Discontinue involuntary commitment. Discharge patient home. The patient was again explained by the staff and understands that it was in her best interest that she take care of her alcohol problems and cirrhosis of liver, but it is doubtful how much help the patient will take.    ____________________________ Crystal MantisSurya K. Crystal Bundehalla, MD skc:ST D: 10/23/2014 18:27:39 ET T: 10/23/2014 23:38:59 ET JOB#: [id number]  cc: Monika SalkSurya K. Crystal Bundehalla, MD, <Dictator> Beau FannySURYA K Tomicka Lover MD ELECTRONICALLY SIGNED 10/29/2014 18:21

## 2014-11-27 NOTE — Consult Note (Signed)
Pt in CIWA protocol and in DT's, mumbling some in response to questions.  No new suggestions.  GI will not see her Friday, Saturday or Sunday unless there is a specific GI problem you need addressed and Dr. Marva PandaSkulskie will be on call.  Electronic Signatures: Scot JunElliott, Brandee Markin T (MD)  (Signed on 21-Jan-16 17:04)  Authored  Last Updated: 21-Jan-16 17:04 by Scot JunElliott, Sascha Palma T (MD)

## 2014-11-27 NOTE — Consult Note (Signed)
Pt admittd with effects of severe alcoholism, currently asleep from meds. No plans to do urgent EGD at this time but continue meds.  When over her withdrawal will do elective EGD at that time or sooner if looks like a major bleed is in progress.  Will follow with you.  Electronic Signatures: Scot JunElliott, Zyrus Hetland T (MD)  (Signed on 19-Jan-16 18:37)  Authored  Last Updated: 19-Jan-16 18:37 by Scot JunElliott, Tawni Melkonian T (MD)

## 2014-11-27 NOTE — Consult Note (Signed)
Psychiatry: Follow-up for this patient with alcohol withdrawal and delirium.  Patient seen today.  She was very sedated but was able to identify where she was and the correct year.  Nursing tells me that she had been speaking more coherently earlier today and had taken a few sips from a cup.  Vital signs continued to show elevated pulse but otherwise unremarkable.  Labs continue to show liver abnormalities and low platelet count.  Patient is not acting out aggressively against herself.  Appears today to be more sedated and not frankly delirious.  Doesn't really look like somebody in DTs very much today. gradual weaning of benzodiazepines using the CIWA taper.  No change to other medications for now.  Unlikely to benefit from psychiatric hospitalization.  Likely to need some kind of structured housing placement if she does not already have it and less she recovers to a much more dramatic degree than I expect. multifactorial, alcohol dependence  Electronic Signatures: Audery Amellapacs, Kelin Nixon T (MD)  (Signed on 21-Jan-16 17:38)  Authored  Last Updated: 21-Jan-16 17:38 by Audery Amellapacs, Josiane Labine T (MD)

## 2014-11-27 NOTE — Consult Note (Signed)
PATIENT NAME:  Crystal [name], Crystal [name] MR#:  [id number] DATE OF BIRTH:  [birth year]  DATE OF CONSULTATION:  08/16/2014  REFERRING PHYSICIAN:  Barbette Reichmann, MD  CONSULTING PHYSICIAN:  Scot Jun, MD/Kirbi Farrugia A. Arvilla Market, ANP (Adult Nurse Practitioner)  REASON FOR CONSULTATION: Melanotic stools with a history of alcoholic cirrhosis.   HISTORY OF PRESENT ILLNESS: This 44 year old Caucasian female is followed by Dr. Shelle Iron for history of alcoholic cirrhosis with grade 1 esophageal varices, LA grade C reflux esophagitis, portal hypertensive gastropathy per recent EGD 11/29/2013. This patient continues to drink alcohol regularly. She presented to the hospital yesterday with 4 day history of vomiting, tarry emesis as well as dark, tarry stools. She noted frank blood in her urine. She reports she has been unable to even retain her alcoholic beverages lately. Her last drink was reportedly greater than 24 hours ago according to the history note. The patient is sedated, unable to give any further history at this time. No family members present. This information came from her admission note, and past admission notes.   PAST MEDICAL HISTORY:  1.  Alcohol cirrhosis.  2.  Chronic alcoholism.  3.  Bipolar disorder.  4.  Polysubstance abuse.  5.  Alcohol-related seizures.  6.  History of brain aneurysm.  7.  Chronic thrombocytopenia.   PAST SURGICAL HISTORY:  1.  Clipped brain aneurysm.  2.  Bilateral tubal ligation.  3.  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   SOCIAL HISTORY: Positive alcohol. She has reported drinking wine and liquor in quantities as much as she can get into her body. She smokes 1 pack a day for 30 years. She had reported history of drug abuse in the past.   FAMILY HISTORY: Positive for diabetes, hypertension, CAD and CHF.  MEDICATIONS ON ADMISSION: See admission H and P note.   ALLERGIES: CODEINE AND HYDROCODONE.   LABORATORY DATA: Liver panel: albumin 3.3. Bilirubin  5.2, alkaline phosphatase 228, AST 229, ALT 69. These values are consistent with past admissions. Her WBC was 12.3, platelet count 110,000, hemoglobin 14.4.   Laboratory studies this morning with glucose 110, potassium 3.1. Chemistry otherwise unremarkable except for calcium 7.3. Liver laboratories have remained essentially unchanged except for a slightly lower albumin 2.8 with n.p.o. status. TSH is 3.91. Hemoglobin 12.4, platelet count down 64,000. Admission ammonia 33, up to 79.   RADIOLOGY: No radiological studies to report.   PHYSICAL EXAMINATION:  GENERAL: The patient is sedated and in bed and unarousable. She mumbles and thrashes when awake. The patient appears disheveled and chronically ill.  HEAD: Normocephalic. Conjunctivae pink.  NECK: Supple. Trachea midline.  CARDIAC: S1, S2 without murmur, rub or gallop.  LUNGS: CTA anteriorly.  ABDOMEN: Distended, soft. Positive bowel sounds and I cannot identify if there is tenderness.  RECTAL: Deferred.  EXTREMITIES: Lower extremities without edema.  SKIN: Warm and dry and there is a rash noted about her chest. Posterior skin was not evaluated.  NEUROLOGIC: The patient is sedated.   IMPRESSION: The patient presents with alcohol intoxication, known history of alcoholic cirrhosis. She does have a history of seizure disorder. She presents with malnutrition. Examination shows a distended abdomen, must consider spontaneous bacterial peritonitis and ascites. Recent EGD 11/2013, results reviewed with findings of LA grade C reflux esophagitis, grade 1 esophageal varices, large amount of residue food in the stomach, portal hypertensive gastropathy. The patient is sedated and unable to participate in the history.   PLAN:  1.  Obtain abdominal ultrasound in the  morning to check for ascites.  2.  Abdomen distention, slight elevation in WBC, deterioration of her status to consider spontaneous bacterial peritonitis. Since she has possible GI bleed, it is a  good idea to begin her on Levaquin 500 mg IV q. 24 hours.  3.  Ideally, would like to do EGD, but like to wait until she gets over her acute alcohol intoxication and initial CIWA medication. Therefore, EGD to be considered in the next few days.  4.  Melanotic stools, stable hemoglobin continue to monitor closely. Ammonia level is elevated, will need monitored. Last year she was given lactulose 30 mL q. 6 hours as an outpatient to titrate to 2 bowel movements daily. Lactulose with Xifaxan may be considered when the patient is taking oral medication.   Thank you for the consultation. This case was discussed with Dr. Mechele CollinElliott in collaboration of care.   These services provided by Cala BradfordKimberly A. Arvilla MarketMills, MS, APRN, BC, ANP (Adult Nurse Practitioner) under collaborative agreement with Scot Junobert T. Elliott, MD.   ____________________________ Ranae PlumberKimberly A. Arvilla MarketMills, ANP (Adult Nurse Practitioner) kam:TT D: 08/16/2014 17:58:41 ET T: 08/16/2014 18:30:44 ET JOB#: [id number]  cc: Cala BradfordKimberly A. Arvilla MarketMills, ANP (Adult Nurse Practitioner), <Dictator> Ranae PlumberKimberly A. Suzette BattiestMills RN, MSN, ANP-BC Adult Nurse Practitioner ELECTRONICALLY SIGNED 08/17/2014 8:31

## 2014-11-27 NOTE — H&P (Signed)
PATIENT NAME:  Crystal [name], Crystal [name] MR#:  [id number] DATE OF BIRTH:  Nov 05, 1970  DATE OF ADMISSION:  10/27/2014  PRIMARY CARE PHYSICIAN:  Tracie Harrier, MD, from [facility].  EMERGENCY ROOM PHYSICIAN:  Eryka A. Edd Fabian, MD  CHIEF COMPLAINT:   Tremors, shaking, and seizures at home.   HISTORY OF PRESENT ILLNESS:  The patient is a 44 year old Caucasian female with past medical history of alcoholic liver cirrhosis, who was brought in to the ED by her husband for shakes and tremors and seizures one time 2 days ago. In the ED, the patient is okay, but her ammonia level is at 73. CIWA score is 13, as reported by the ED staff. She was given lactulose, and the hospitalist team was called to admit the patient. Initial alcohol level was 378 on March 26 and today it is at 54. During my examination, the patient is answering all questions appropriately, complaining of left upper quadrant abdominal pain. She is very thirsty and wants to drink. Her husband is at bedside. No other complaints.   REVIEW OF SYSTEMS: CONSTITUTIONAL:  Denies any fever or weakness.  EYES:  Denies blurry vision or double vision.  EARS, NOSE, AND THROAT:  Denies epistaxis or discharge.  RESPIRATORY:  Denies cough or COPD.  CARDIOVASCULAR:  No chest pain, palpitations, or orthopnea.  GASTROINTESTINAL:  Complaining of nausea. No vomiting, diarrhea, or melena.  GENITOURINARY:  No dysuria or hematuria.  ENDOCRINE:  Denies polyuria, but complaining of polydipsia.  LYMPHATIC:  Admits to easy bruising and bleeding. She noticed pink-colored urine.  SKIN:  No rashes. No lesions.  MUSCULOSKELETAL:  Denies any arthralgia or myalgia.  NEUROLOGIC:  Denies any numbness in her extremities. Denies any dysarthria.  PSYCHIATRIC:  No depression. No suicidal ideation.   PAST MEDICAL HISTORY:  Liver cirrhosis, alcoholism, seizure disorder, attention deficit/hyperactivity disorder, bipolar disorder, herniated disk.   PAST SURGICAL HISTORY:  Clipped brain  aneurysm, bilateral tubal ligation, second and third DIP joint amputation of the left hand following infiltration of an IV.  ALLERGIC TO:  CODEINE AND HYDROCODONE.   PSYCHOSOCIAL HISTORY:  Lives at home with husband. Drinks alcohol heavily, drank 3 bottles of liquor yesterday. She still smokes 1/2 pack a day. Denies any street drugs.   FAMILY HISTORY:  Diabetes, hypertension, coronary artery disease, and CHF.   HOME MEDICATIONS:  Cipro 250 mg 2 times a day, clonazepam 1 mg p.o. once a day as needed for anxiety, gabapentin 600 mg p.o. 3 times a day, Keppra 500 mg 1 tablet p.o. 2 times a day, multivitamin 1 tablet p.o. once daily, Seroquel 150 mg extended-release p.o. once a day, spironolactone 25 mg p.o. once daily, Ventolin 2 puff inhalations every 4 hours as needed for shortness of breath.   PHYSICAL EXAMINATION: VITAL SIGNS:  Temperature 98, pulse 103, respirations 20, blood pressure 97/74, pulse oximetry 94% on room air.  GENERAL APPEARANCE:  Not in any acute distress, having tremors.  HEENT:  Normocephalic, atraumatic. Pupils are equally reactive to light and accommodation. No conjunctival injection. No sinus tenderness. Dry mucous membranes.  NECK:  Supple. No JVD. No thyromegaly. Range of motion is intact.  LUNGS:  Clear to auscultation bilaterally. No accessory muscle use. There is no anterior chest wall tenderness on palpation.  CARDIAC:  S1 and S2, normal. Regular rhythm, tachycardic.  GASTROINTESTINAL:  Soft. Positive for left upper quadrant tenderness. No diffuse tenderness. No rebound tenderness. Distended abdomen.  NEUROLOGIC:  Awake, alert, and oriented. She has been answering all questions appropriately,  but has tremors in her extremities. Reflexes are 2+. She is following verbal commands.  EXTREMITIES:  No cyanosis. No clubbing.  PSYCHIATRIC:  Flat mood and affect.   LABORATORY AND IMAGING STUDIES:  Urine drug screen is negative. Troponin is less than 0.03. TSH and free T4 are  normal. Ammonia level is 73. Lipase is 33. Serum alcohol level is 247. BMP:  Normal except potassium at 3.4. WBC is 5.9, hemoglobin and hematocrit normal, and platelets are at 57,000. Urine culture:  Greater than 100,000 colonies of Escherichia coli sensitive to ceftriaxone and ciprofloxacin per March 26 by culture report. Tylenol level is less than 4.   CT of the head:  No acute intracranial abnormality, right  craniotomy. Chest x-ray, portable, single view:  No active disease.   ASSESSMENT AND PLAN:  This is a 44 year old female presenting to the ED with tremors and shakes. She will be admitted with the following assessment and plan:   1.  Acute alcohol withdrawal with alcohol intoxication. CIWA score is at 13. We will admit her to telemetry, put her on CIWA protocol and provide Ativan on an as-needed basis. We will put in a consult to psychiatry. We will provide her multivitamin, thiamine, and folate. Monitor her electrolytes including potassium and magnesium closely.  2.  Acute cystitis according to the previous urine culture report on March 26, urine culture is positive for Escherichia coli sensitive to Cipro and Rocephin. We will continue Rocephin. Repeat urine culture is pending at this time. Liver cirrhosis is probably from alcohol intoxication. The patient was suggested to stop drinking alcohol. She does not need a paracentesis at this time. Ammonia levels are high at 73, but we do not know what her baseline is. The patient is still communicating. We will continue lactulose and follow up on the ammonia levels. 3.  Thrombocytopenia, probably from liver cirrhosis. No acute bleeding or bruising. No petechiae or purpura are noted. We will continue monitoring platelet count.  4.  History of bipolar disorder and attention deficit/hyperactivity disorder. Psychiatric consult is placed.  5.  Tobacco abuse. Counseled the patient to quit smoking for  4 to 5 minutes. The patient will be on nicotine inhaler  kit.   Plan of care was discussed in detail with the patient and her husband at bedside. They both verbalized understanding of the plan.   TOTAL TIME SPENT ON THE ADMISSION:  50 minutes.   CODE STATUS:  She is a full code. Husband is the medical power of attorney.    ____________________________ Nicholes Mango, MD ag:nb D: 10/27/2014 19:56:49 ET T: 10/27/2014 23:46:45 ET JOB#: [id number]  cc: Nicholes Mango, MD, <Dictator> Nicholes Mango MD ELECTRONICALLY SIGNED 11/11/2014 12:40

## 2014-11-27 NOTE — Discharge Summary (Signed)
PATIENT NAME:  Crystal [name], [name] MR#:  [id number] DATE OF BIRTH:  March 25, 1971  DATE OF ADMISSION:  10/27/2014 DATE OF DISCHARGE:  10/28/2014  Discharged against medical advice.  HOSPITAL COURSE:  The patient was admitted to the hospital by me on March 31 with a diagnosis of acute cystitis and alcohol intoxication. The patient was placed on CIWA protocol and admitted to the hospital and even before the patient was seen by her primary care physician, Dr. Barbette ReichmannVishwanath Hande, on April 1 left the patient left the hospital against medical advice at 1:05 a.m. as per nurse's note.  The patient told the nurse that she wants to leave against medical advice.  It was notified to the on-call doctor, Dr. Anne HahnWillis.  The patient signed AMA paper to leave against medical advice.  Patient was instructed on consequences of leaving against medical advice and patient verbalized understanding. She was alert and oriented at the time of discharge.  IV was removed and patient left the floor with her friend.   ____________________________ Ramonita LabAruna Derak Schurman, MD ag:sp D: 11/02/2014 12:19:51 ET T: 11/02/2014 12:55:13 ET JOB#: [id number]  cc: Ramonita LabAruna Kasch Borquez, MD, <Dictator> Barbette ReichmannVishwanath Hande, MD Ramonita LabARUNA Arleta Ostrum MD ELECTRONICALLY SIGNED 11/10/2014 12:39

## 2014-11-27 NOTE — Consult Note (Signed)
PATIENT NAME:  Aram CandelaRKER, Crystal [name] DATE OF BIRTH:  05/05/1971  DATE OF CONSULTATION:  08/17/2014  REFERRING PHYSICIAN:   CONSULTING PHYSICIAN:  Audery AmelJohn T. Jozalynn Noyce, MD  IDENTIFYING INFORMATION AND REASON FOR CONSULT: This is a 44 year old woman with a history of alcohol abuse and chronic mental illness who presented to the hospital on the 18th because of black stools and ongoing alcohol abuse. Consult is for altered mental status.   HISTORY OF PRESENT ILLNESS:  Information obtained from the chart. The patient is not able to give any history at this time. She presented to the hospital on the 18th and at that time had a blood alcohol level over 200. The patient's mental status evidently has worsened over the last couple of days. Nursing staff reports that today she has been intermittently agitated and confused, but has been sleeping when she was able to be sedated. The patient is not able to report any other psychiatric symptoms.   PAST PSYCHIATRIC HISTORY: Long history of alcohol abuse with multiple admissions for detoxification and withdrawal. It is not clear if she has had full DTs in the past. The patient also has a diagnosis of bipolar disorder the exact nature of which seems to be a little confusing as it is probably all tied up with her alcohol abuse as well. She has been stabilized on a modest dose of Seroquel.   FAMILY HISTORY: Unknown.   SOCIAL HISTORY: Not known at this time. I believe she had been married as of a year ago, but whether she is still with that person or what her recurrent living situation is clear to me at this time. Also still smokes regularly.   SUBSTANCE ABUSE HISTORY: Heavy alcohol abuse, history of withdrawal, currently having delirium tremens. History of some other substance abuse, but nothing compared to the alcohol.   PAST MEDICAL HISTORY: The patient has alcohol-induced cirrhosis. She has a seizure disorder which has long been treated with anticonvulsants  although it may be primarily related to her alcohol use as well. Has COPD. Has chronic pain apparently. High blood pressure.   REVIEW OF SYSTEMS: The patient not able to offer any.  MENTAL STATUS EXAMINATION: The patient was asleep when I came to see her, and would not rouse to saying her name loudly or light shaking of her arm. Not able to offer any further history at this time.   LABORATORY RESULTS: The most recent chemistries from today, her creatinine is low at 0.59, chloride elevated slightly, calcium low at 7.5. The last set of liver tests was done yesterday, she had an ALT of 65, AST still 245, albumin low at 2.8, bilirubin elevated at 5.6. She also had a plasma ammonia level yesterday of 79 which was an increase over her admission level of 33. Continues to have very low platelet count at 51,000.   VITAL SIGNS: Most recently blood pressure 117/79, pulse 111 which is an increase over the last few days, respirations 20, temperature 99. She has not been febrile during this hospital stay.    ASSESSMENT:  A 44 year old woman who now appears to probably be having delirium tremens. This would be an appropriate time given that it is 2-3 days after her last use of alcohol. She is being given frequent Ativan which is certainly the appropriate treatment. Vital signs are being maintained in a safe pattern. I also would note that she has an elevated ammonia level and that it is quite possible that some confusion and mental  status change is related to that.   TREATMENT PLAN: Continue current Ativan doses 2 mg every 2 hours as needed. Re-evaluate ongoing as she comes through this. I am not going to presume to start lactulose as that is out of my specialty range, but I do note again that her elevated ammonia level might be part of her mental status problem and that it might be worth rechecking or treating.   DIAGNOSIS PRINCIPAL AND PRIMARY:   AXIS I:  1.  Delirium secondary to alcohol withdrawal and delirium  tremens.  2.  Alcohol abuse, severe.  3.  Bipolar not otherwise specified.   AXIS III:   1.  Cirrhosis.  2.  Chronic obstructive pulmonary disease.     ____________________________ Audery Amel, MD jtc:bu D: 08/17/2014 18:06:38 ET T: 08/17/2014 18:21:19 ET JOB#: [id number]  cc: Audery Amel, MD, <Dictator> Audery Amel MD ELECTRONICALLY SIGNED 09/07/2014 17:20

## 2014-11-27 NOTE — Consult Note (Signed)
Pt in alcoholic protocol and asleep from Ativan at this moment.  Chest clear, heart RRR, abd with fullness in upper abd.  Oxygen sat 93% on 2L. PT 19.5 sec. Plt ct 51.  Skin with spider angiomas diffusely.  A: severe alcoholism now in CIWA protocol.  No new GI imput.  Electronic Signatures: Scot JunElliott, Darcia Lampi T (MD)  (Signed on 20-Jan-16 17:34)  Authored  Last Updated: 20-Jan-16 17:34 by Scot JunElliott, Amorita Vanrossum T (MD)

## 2014-11-27 NOTE — H&P (Signed)
PATIENT NAME:  Crystal [name], Crystal [name] MR#:  [id number] DATE OF BIRTH:  1971/05/08  DATE OF ADMISSION:  08/15/2014  REFERRING PHYSICIAN: Shipman Sink. Dolores Frame, MD  PRIMARY CARE PHYSICIAN: Barbette Reichmann, MD  ADMITTING DIAGNOSES: Alcohol intoxication and melena.   HISTORY OF PRESENT ILLNESS: This is a 44 year old Caucasian female who presents to the Emergency Department complaining of vomiting for 4 days. The patient admits to having tarry emesis as well as dark, tarry stools during that time. She has also noticed frank blood in her urine as well. The patient readily admits to alcohol abuse, but states that she has been unable to keep even her alcoholic beverages down lately. Her last drink was more than 24 hours ago. In the Emergency Department, the patient was found to have an elevated ethanol level as well as Hemoccult positive stools, which prompted them to call for admission.  REVIEW OF SYSTEMS: CONSTITUTIONAL: The patient denies fever or weakness.  EYES: Denies blurred vision or inflammation.  EARS, NOSE, AND THROAT: Denies tinnitus or sore throat.  RESPIRATORY: Denies shortness of breath or cough.  CARDIOVASCULAR: Denies chest pain, palpitations, orthopnea, or paroxysmal nocturnal dyspnea.  GASTROINTESTINAL: Admits to nausea, vomiting, diarrhea, and melena. The patient also admits to some abdominal pain.  GENITOURINARY: Admits to hematuria, but denies dysuria, increased frequency, or hesitancy of urination.  ENDOCRINE: Denies polyuria or polydipsia. HEMATOLOGIC AND LYMPHATIC: Admits to easy bruising and bleeding per rectum and in her urine.  SKIN: Denies rashes or lesions.  MUSCULOSKELETAL: Denies arthralgias or myalgias.  NEUROLOGIC: Denies numbness in her extremities or dysarthria.  PSYCHIATRIC: Denies depression or suicidal ideation.   PAST MEDICAL HISTORY: Cirrhosis of the liver, alcoholism, seizure disorder, herniated disks.   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  SOCIAL HISTORY: The patient admits to drinking alcohol. She drinks wine or liquor in quantities "as much as she can get into her body." She smokes 1 pack a day for the last 30 years. She denies any drug use now, but admits to a history of drug abuse in the past.   FAMILY HISTORY: Diabetes, hypertension, coronary artery disease, and/or CHF.  MEDICATIONS: 1.  Albuterol with ipratropium 2.5 mg/0.5 mg/3 mL inhaled solution, 1 nebulizer treatment every 6 hours as needed for coughing, wheezing, or shortness of breath.  2.  Clonazepam 1 mg 1 tablet p.o. b.i.d.  3.  Gabapentin 600 mg 1 tablet p.o. t.i.d. 4.  Keppra 500 mg in the morning and 750 mg p.o. every evening.  5.  Lansoprazole 15 mg with a delayed release capsule 1 capsule p.o. daily.  6.  Multivitamin 1 tab p.o. daily.  7.  Seroquel XR 150 mg extended release 1 tablet p.o. daily.  8.  Spiriva 1.25 mg/inhalation 2 puffs inhaled daily.  9.  Spironolactone 25 mg 1 tablet p.o. daily.  10.  Thiamine 100 mg 1 tab p.o. once a day.   ALLERGIES: CODEINE AND HYDROCODONE.  PERTINENT LABORATORY RESULTS AND RADIOGRAPHIC FINDINGS: Serum glucose is 66. BUN 14, creatinine 0.64. Serum sodium 164, potassium is 3.5, chloride 104, bicarbonate is 25, lipase is 171, plasma ammonia level is 33. Ethanol level is 265, serum albumin is 3.3, alkaline phosphatase 228, AST 229, ALT 69. White blood cell count is 12.3, hemoglobin 14.4, hematocrit 43.3, platelet count 110,000. MCV is 102. Urinalysis is not interpretable because of the gross amount of blood; however, there are 27  white blood cells per high-power field.   PHYSICAL EXAMINATION: VITAL SIGNS: Temperature is 98.2, pulse 106, respirations 13, blood pressure 111/78, pulse oximetry is 96% on room air.  GENERAL: The patient is alert and oriented x3, in no apparent distress.  HEENT: Normocephalic, atraumatic.  Pupils equal, round, and reactive to light and accommodation. Extraocular movements are intact. Mucous membranes are slightly tacky.  NECK: Trachea is midline. No adenopathy. Thyroid is nonpalpable, nontender.  CHEST: Symmetric and atraumatic.  CARDIOVASCULAR: Regular rate and rhythm. Normal S1, S2. No rubs, clicks, or murmurs appreciated.  LUNGS: Clear to auscultation bilaterally. Normal effort and excursion.  ABDOMEN: Positive bowel sounds. Soft, nontender. The abdomen is distended. There is positive fluid wave indicative of ascites. I do not detect any hepatosplenomegaly. She does have some mild rebound tenderness.  GENITOURINARY: Normal external female genitalia.  MUSCULOSKELETAL: The patient moves all 4 extremities equally. I have not walked her to test her overall strength.  SKIN: Warm and dry. There are no rashes or lesions.  EXTREMITIES: No clubbing, cyanosis, or edema.  NEUROLOGIC: Cranial nerves II through XII are grossly intact.  PSYCHIATRIC: Mood is normal. Affect is congruent. The patient is histrionic. She does not have the best judgment, but has excellent insight into her illness.   ASSESSMENT AND PLAN: This is a 44 year old female admitted for alcohol intoxication and melena.  1.  Alcohol intoxication. We will aggressively hydrate the patient. She has been placed on a [facility] Withdrawal Assessment (CIWA) scale. I have ordered benzodiazepines, scheduled as needed for withdrawal symptoms or general agitation.  2.  Melena. This likely represents an upper gastrointestinal bleed, probably gastritis. The patient's hemoglobin and hematocrit are absolutely normal. The Emergency Department has started a Protonix drip, but this may be unnecessary. Gastroenterology consultation at the discretion of the primary team.  3.  Cirrhosis of the liver. This is due to chronic alcoholism. I will add propranolol for variceal bleed risk reduction.  4.  Seizure disorder. Continue Keppra.  5.   Chronic obstructive pulmonary disease. Continue Spiriva scheduled, albuterol p.r.n.  6.  Malnutrition. The patient gets all her calories from alcohol. She admits to not eating well-balanced meals, although her BMI is within the normal range at this time; however, her MCV indicates megaloblastic red blood cells indicating folic acid deficiency and B12 deficiency.  7.  Deep vein thrombosis prophylaxis. Sequential compression devices.  8.  Gastrointestinal prophylaxis as above.  CODE STATUS: The patient is a FULL CODE.  TIME SPENT ON ADMISSION ORDERS AND PATIENT CARE: Approximately 35 minutes.   ____________________________ Kelton PillarMichael S. Sheryle Hailiamond, MD msd:sw D: 08/15/2014 07:26:49 ET T: 08/15/2014 07:57:22 ET JOB#: [id number]  cc: Kelton PillarMichael S. Sheryle Hailiamond, MD, <Dictator> Kelton PillarMICHAEL S Paislea Hatton MD ELECTRONICALLY SIGNED 08/31/2014 6:56

## 2015-01-30 ENCOUNTER — Emergency Department: Payer: Medicaid Other

## 2015-01-30 ENCOUNTER — Encounter: Payer: Self-pay | Admitting: Emergency Medicine

## 2015-01-30 ENCOUNTER — Inpatient Hospital Stay
Admission: EM | Admit: 2015-01-30 | Discharge: 2015-02-06 | DRG: 441 | Disposition: A | Discharge status: Hospice | Payer: Medicaid Other | Attending: Internal Medicine | Admitting: Internal Medicine

## 2015-01-30 DIAGNOSIS — F419 Anxiety disorder, unspecified: Secondary | ICD-10-CM | POA: Diagnosis present

## 2015-01-30 DIAGNOSIS — F319 Bipolar disorder, unspecified: Secondary | ICD-10-CM | POA: Diagnosis present

## 2015-01-30 DIAGNOSIS — F1020 Alcohol dependence, uncomplicated: Secondary | ICD-10-CM | POA: Diagnosis present

## 2015-01-30 DIAGNOSIS — K7200 Acute and subacute hepatic failure without coma: Principal | ICD-10-CM | POA: Insufficient documentation

## 2015-01-30 DIAGNOSIS — Z515 Encounter for palliative care: Secondary | ICD-10-CM

## 2015-01-30 DIAGNOSIS — K7682 Hepatic encephalopathy: Secondary | ICD-10-CM | POA: Diagnosis present

## 2015-01-30 DIAGNOSIS — F17210 Nicotine dependence, cigarettes, uncomplicated: Secondary | ICD-10-CM | POA: Diagnosis present

## 2015-01-30 DIAGNOSIS — K766 Portal hypertension: Secondary | ICD-10-CM | POA: Diagnosis present

## 2015-01-30 DIAGNOSIS — E872 Acidosis: Secondary | ICD-10-CM | POA: Diagnosis present

## 2015-01-30 DIAGNOSIS — K7010 Alcoholic hepatitis without ascites: Secondary | ICD-10-CM

## 2015-01-30 DIAGNOSIS — I214 Non-ST elevation (NSTEMI) myocardial infarction: Secondary | ICD-10-CM

## 2015-01-30 DIAGNOSIS — E871 Hypo-osmolality and hyponatremia: Secondary | ICD-10-CM | POA: Diagnosis present

## 2015-01-30 DIAGNOSIS — Z885 Allergy status to narcotic agent status: Secondary | ICD-10-CM | POA: Diagnosis not present

## 2015-01-30 DIAGNOSIS — K219 Gastro-esophageal reflux disease without esophagitis: Secondary | ICD-10-CM | POA: Diagnosis present

## 2015-01-30 DIAGNOSIS — G9340 Encephalopathy, unspecified: Secondary | ICD-10-CM | POA: Diagnosis present

## 2015-01-30 DIAGNOSIS — D72829 Elevated white blood cell count, unspecified: Secondary | ICD-10-CM | POA: Diagnosis present

## 2015-01-30 DIAGNOSIS — K208 Other esophagitis: Secondary | ICD-10-CM | POA: Diagnosis present

## 2015-01-30 DIAGNOSIS — Z66 Do not resuscitate: Secondary | ICD-10-CM | POA: Diagnosis present

## 2015-01-30 DIAGNOSIS — A419 Sepsis, unspecified organism: Secondary | ICD-10-CM | POA: Diagnosis present

## 2015-01-30 DIAGNOSIS — Z452 Encounter for adjustment and management of vascular access device: Secondary | ICD-10-CM

## 2015-01-30 DIAGNOSIS — D649 Anemia, unspecified: Secondary | ICD-10-CM | POA: Diagnosis present

## 2015-01-30 DIAGNOSIS — R000 Tachycardia, unspecified: Secondary | ICD-10-CM | POA: Diagnosis present

## 2015-01-30 DIAGNOSIS — K7290 Hepatic failure, unspecified without coma: Secondary | ICD-10-CM | POA: Diagnosis present

## 2015-01-30 DIAGNOSIS — R140 Abdominal distension (gaseous): Secondary | ICD-10-CM | POA: Diagnosis present

## 2015-01-30 DIAGNOSIS — D689 Coagulation defect, unspecified: Secondary | ICD-10-CM | POA: Diagnosis present

## 2015-01-30 DIAGNOSIS — K767 Hepatorenal syndrome: Secondary | ICD-10-CM | POA: Diagnosis present

## 2015-01-30 DIAGNOSIS — K3189 Other diseases of stomach and duodenum: Secondary | ICD-10-CM | POA: Diagnosis present

## 2015-01-30 DIAGNOSIS — E860 Dehydration: Secondary | ICD-10-CM | POA: Diagnosis present

## 2015-01-30 DIAGNOSIS — G319 Degenerative disease of nervous system, unspecified: Secondary | ICD-10-CM | POA: Diagnosis present

## 2015-01-30 DIAGNOSIS — K7011 Alcoholic hepatitis with ascites: Secondary | ICD-10-CM | POA: Diagnosis present

## 2015-01-30 DIAGNOSIS — N179 Acute kidney failure, unspecified: Secondary | ICD-10-CM | POA: Diagnosis present

## 2015-01-30 DIAGNOSIS — E875 Hyperkalemia: Secondary | ICD-10-CM | POA: Diagnosis present

## 2015-01-30 DIAGNOSIS — R161 Splenomegaly, not elsewhere classified: Secondary | ICD-10-CM | POA: Diagnosis present

## 2015-01-30 DIAGNOSIS — K7460 Unspecified cirrhosis of liver: Secondary | ICD-10-CM | POA: Diagnosis present

## 2015-01-30 DIAGNOSIS — G40909 Epilepsy, unspecified, not intractable, without status epilepticus: Secondary | ICD-10-CM | POA: Diagnosis present

## 2015-01-30 DIAGNOSIS — F10231 Alcohol dependence with withdrawal delirium: Secondary | ICD-10-CM | POA: Diagnosis present

## 2015-01-30 DIAGNOSIS — J449 Chronic obstructive pulmonary disease, unspecified: Secondary | ICD-10-CM | POA: Diagnosis present

## 2015-01-30 DIAGNOSIS — F909 Attention-deficit hyperactivity disorder, unspecified type: Secondary | ICD-10-CM | POA: Diagnosis present

## 2015-01-30 HISTORY — DX: Bipolar disorder, unspecified: F31.9

## 2015-01-30 HISTORY — DX: Attention-deficit hyperactivity disorder, unspecified type: F90.9

## 2015-01-30 HISTORY — DX: Suicide attempt, initial encounter: T14.91XA

## 2015-01-30 HISTORY — DX: Gastro-esophageal reflux disease without esophagitis: K21.9

## 2015-01-30 HISTORY — DX: Cerebral aneurysm, nonruptured: I67.1

## 2015-01-30 HISTORY — DX: Anxiety disorder, unspecified: F41.9

## 2015-01-30 HISTORY — DX: Unspecified cirrhosis of liver: K74.60

## 2015-01-30 HISTORY — DX: Chronic obstructive pulmonary disease, unspecified: J44.9

## 2015-01-30 HISTORY — DX: Unspecified convulsions: R56.9

## 2015-01-30 HISTORY — DX: Acute pancreatitis without necrosis or infection, unspecified: K85.90

## 2015-01-30 LAB — CBC WITH DIFFERENTIAL/PLATELET
BASOS ABS: 0 10*3/uL (ref 0–0.1)
BASOS PCT: 0 %
EOS ABS: 0.9 10*3/uL — AB (ref 0–0.7)
Eosinophils Relative: 4 %
HEMATOCRIT: 33 % — AB (ref 35.0–47.0)
Hemoglobin: 11.3 g/dL — ABNORMAL LOW (ref 12.0–16.0)
LYMPHS PCT: 10 %
Lymphs Abs: 2.3 10*3/uL (ref 1.0–3.6)
MCH: 34.6 pg — AB (ref 26.0–34.0)
MCHC: 34.1 g/dL (ref 32.0–36.0)
MCV: 101.5 fL — ABNORMAL HIGH (ref 80.0–100.0)
MONO ABS: 3 10*3/uL — AB (ref 0.2–0.9)
Monocytes Relative: 13 %
NEUTROS ABS: 17.1 10*3/uL — AB (ref 1.4–6.5)
NEUTROS PCT: 73 %
PLATELETS: 180 10*3/uL (ref 150–440)
RBC: 3.25 MIL/uL — ABNORMAL LOW (ref 3.80–5.20)
RDW: 16.3 % — AB (ref 11.5–14.5)
WBC: 23.3 10*3/uL — ABNORMAL HIGH (ref 3.6–11.0)

## 2015-01-30 LAB — URINALYSIS COMPLETE WITH MICROSCOPIC (ARMC ONLY)
Glucose, UA: 50 mg/dL — AB
Ketones, ur: NEGATIVE mg/dL
Leukocytes, UA: NEGATIVE
Nitrite: NEGATIVE
PH: 5 (ref 5.0–8.0)
Protein, ur: 30 mg/dL — AB
Specific Gravity, Urine: 1.019 (ref 1.005–1.030)
WBC UA: NONE SEEN WBC/hpf (ref 0–5)

## 2015-01-30 LAB — COMPREHENSIVE METABOLIC PANEL
ALK PHOS: 175 U/L — AB (ref 38–126)
ALT: 202 U/L — ABNORMAL HIGH (ref 14–54)
AST: 395 U/L — ABNORMAL HIGH (ref 15–41)
Albumin: 2.3 g/dL — ABNORMAL LOW (ref 3.5–5.0)
Anion gap: 12 (ref 5–15)
BUN: 24 mg/dL — AB (ref 6–20)
CO2: 23 mmol/L (ref 22–32)
Calcium: 8.4 mg/dL — ABNORMAL LOW (ref 8.9–10.3)
Chloride: 93 mmol/L — ABNORMAL LOW (ref 101–111)
Creatinine, Ser: 2.98 mg/dL — ABNORMAL HIGH (ref 0.44–1.00)
GFR calc Af Amer: 21 mL/min — ABNORMAL LOW (ref >60)
GFR, EST NON AFRICAN AMERICAN: 18 mL/min — AB (ref >60)
Glucose, Bld: 133 mg/dL — ABNORMAL HIGH (ref 65–99)
POTASSIUM: 4.8 mmol/L (ref 3.5–5.1)
Sodium: 128 mmol/L — ABNORMAL LOW (ref 135–145)
Total Bilirubin: 26.6 mg/dL (ref 0.3–1.2)
Total Protein: 6.7 g/dL (ref 6.5–8.1)

## 2015-01-30 LAB — TYPE AND SCREEN
ABO/RH(D): O POS
ANTIBODY SCREEN: NEGATIVE

## 2015-01-30 LAB — LACTIC ACID, PLASMA
LACTIC ACID, VENOUS: 2.5 mmol/L — AB (ref 0.5–2.0)
Lactic Acid, Venous: 2.3 mmol/L (ref 0.5–2.0)

## 2015-01-30 LAB — ACETAMINOPHEN LEVEL: Acetaminophen (Tylenol), Serum: <10 ug/mL — ABNORMAL LOW (ref 10–30)

## 2015-01-30 LAB — ETHANOL

## 2015-01-30 LAB — TROPONIN I
Troponin I: 0.07 ng/mL — ABNORMAL HIGH (ref <0.031)
Troponin I: 0.07 ng/mL — ABNORMAL HIGH (ref <0.031)

## 2015-01-30 LAB — APTT: APTT: 52 s — AB (ref 24–36)

## 2015-01-30 LAB — PROTIME-INR
INR: 2.39
PROTHROMBIN TIME: 26.2 s — AB (ref 11.4–15.0)

## 2015-01-30 LAB — HCG, QUANTITATIVE, PREGNANCY: hCG, Beta Chain, Quant, S: 3 m[IU]/mL (ref <5)

## 2015-01-30 LAB — AMMONIA: AMMONIA: 109 umol/L — AB (ref 9–35)

## 2015-01-30 LAB — BILIRUBIN, DIRECT: Bilirubin, Direct: 16.4 mg/dL — ABNORMAL HIGH (ref 0.1–0.5)

## 2015-01-30 LAB — SALICYLATE LEVEL

## 2015-01-30 LAB — LIPASE, BLOOD: Lipase: 34 U/L (ref 22–51)

## 2015-01-30 MED ORDER — IPRATROPIUM-ALBUTEROL 0.5-2.5 (3) MG/3ML IN SOLN: 3.0000 mL | Freq: Four times a day (QID) | RESPIRATORY_TRACT | Status: DC | PRN

## 2015-01-30 MED ORDER — LORAZEPAM 0.5 MG PO TABS: 1.0000 mg | ORAL_TABLET | Freq: Four times a day (QID) | ORAL | Status: DC | PRN

## 2015-01-30 MED ORDER — LACTULOSE 10 GM/15ML PO SOLN
30.0000 g | Freq: Three times a day (TID) | ORAL | Status: DC
  Administered 2015-01-30 – 2015-01-31 (×4): 30 g via ORAL
  Administered 2015-01-31: 10:00:00 via ORAL
  Administered 2015-02-01 – 2015-02-02 (×5): 30 g via ORAL
  Filled 2015-01-30 (×9): qty 60

## 2015-01-30 MED ORDER — ALBUTEROL SULFATE (2.5 MG/3ML) 0.083% IN NEBU: 2.5000 mg | INHALATION_SOLUTION | Freq: Four times a day (QID) | RESPIRATORY_TRACT | Status: DC | PRN

## 2015-01-30 MED ORDER — VANCOMYCIN HCL IN DEXTROSE 1-5 GM/200ML-% IV SOLN
INTRAVENOUS | Status: AC
  Administered 2015-01-30: 1000 mg via INTRAVENOUS
  Filled 2015-01-30: qty 200

## 2015-01-30 MED ORDER — LORAZEPAM 2 MG/ML IJ SOLN: 0.0000 mg | Freq: Two times a day (BID) | INTRAMUSCULAR | Status: DC

## 2015-01-30 MED ORDER — QUETIAPINE FUMARATE ER 50 MG PO TB24
150.0000 mg | ORAL_TABLET | Freq: Every day | ORAL | Status: DC
  Administered 2015-01-30 – 2015-02-01 (×3): 150 mg via ORAL
  Filled 2015-01-30 (×4): qty 3

## 2015-01-30 MED ORDER — PIPERACILLIN-TAZOBACTAM 3.375 G IVPB 30 MIN
3.3750 g | Freq: Three times a day (TID) | INTRAVENOUS | Status: DC
  Administered 2015-01-30 – 2015-02-06 (×19): 3.375 g via INTRAVENOUS
  Filled 2015-01-30 (×26): qty 50

## 2015-01-30 MED ORDER — PIPERACILLIN-TAZOBACTAM 3.375 G IVPB
INTRAVENOUS | Status: AC
  Administered 2015-01-30: 3.375 g via INTRAVENOUS
  Filled 2015-01-30: qty 50

## 2015-01-30 MED ORDER — PREDNISONE 20 MG PO TABS
40.0000 mg | ORAL_TABLET | Freq: Every day | ORAL | Status: DC
Start: 2015-01-31 — End: 2015-02-04
  Administered 2015-01-31 – 2015-02-02 (×3): 40 mg via ORAL
  Filled 2015-01-30 (×5): qty 2

## 2015-01-30 MED ORDER — VANCOMYCIN HCL IN DEXTROSE 1-5 GM/200ML-% IV SOLN
1000.0000 mg | INTRAVENOUS | Status: DC
  Administered 2015-02-01 – 2015-02-02 (×2): 1000 mg via INTRAVENOUS
  Filled 2015-01-30 (×4): qty 200

## 2015-01-30 MED ORDER — LORAZEPAM 2 MG/ML IJ SOLN: 0.0000 mg | Freq: Four times a day (QID) | INTRAMUSCULAR | Status: DC

## 2015-01-30 MED ORDER — PIPERACILLIN-TAZOBACTAM 3.375 G IVPB
INTRAVENOUS | Status: AC
  Filled 2015-01-30: qty 50

## 2015-01-30 MED ORDER — THIAMINE HCL 100 MG/ML IJ SOLN: 100.0000 mg | Freq: Every day | INTRAMUSCULAR | Status: DC

## 2015-01-30 MED ORDER — VANCOMYCIN HCL IN DEXTROSE 1-5 GM/200ML-% IV SOLN
1000.0000 mg | Freq: Once | INTRAVENOUS | Status: AC
  Administered 2015-01-30: 1000 mg via INTRAVENOUS

## 2015-01-30 MED ORDER — SODIUM CHLORIDE 0.9 % IV BOLUS (SEPSIS)
500.0000 mL | Freq: Once | INTRAVENOUS | Status: AC
  Administered 2015-01-30: 500 mL via INTRAVENOUS

## 2015-01-30 MED ORDER — PREDNISONE 20 MG PO TABS
30.0000 mg | ORAL_TABLET | Freq: Once | ORAL | Status: AC
  Administered 2015-01-30: 30 mg via ORAL

## 2015-01-30 MED ORDER — ONDANSETRON HCL 4 MG/2ML IJ SOLN
4.0000 mg | Freq: Four times a day (QID) | INTRAMUSCULAR | Status: DC | PRN
  Administered 2015-01-30: 4 mg via INTRAVENOUS
  Filled 2015-01-30: qty 2

## 2015-01-30 MED ORDER — VITAMIN B-1 100 MG PO TABS
100.0000 mg | ORAL_TABLET | Freq: Every day | ORAL | Status: DC
  Administered 2015-01-30 – 2015-01-31 (×2): 100 mg via ORAL
  Filled 2015-01-30 (×2): qty 1

## 2015-01-30 MED ORDER — LACTULOSE 10 GM/15ML PO SOLN
ORAL | Status: AC
  Filled 2015-01-30: qty 60

## 2015-01-30 MED ORDER — ONDANSETRON HCL 4 MG PO TABS: 4.0000 mg | ORAL_TABLET | Freq: Four times a day (QID) | ORAL | Status: DC | PRN

## 2015-01-30 MED ORDER — OXYCODONE HCL 5 MG PO TABS
5.0000 mg | ORAL_TABLET | Freq: Four times a day (QID) | ORAL | Status: DC | PRN
Start: 2015-01-30 — End: 2015-02-06
  Administered 2015-01-30 – 2015-02-01 (×3): 5 mg via ORAL
  Filled 2015-01-30 (×3): qty 1

## 2015-01-30 MED ORDER — ALBUTEROL SULFATE HFA 108 (90 BASE) MCG/ACT IN AERS: 2.0000 | INHALATION_SPRAY | Freq: Four times a day (QID) | RESPIRATORY_TRACT | Status: DC | PRN

## 2015-01-30 MED ORDER — FOLIC ACID 1 MG PO TABS
1.0000 mg | ORAL_TABLET | Freq: Every day | ORAL | Status: DC
  Administered 2015-01-30 – 2015-01-31 (×2): 1 mg via ORAL
  Filled 2015-01-30 (×2): qty 1

## 2015-01-30 MED ORDER — PIPERACILLIN-TAZOBACTAM 3.375 G IVPB
3.3750 g | Freq: Once | INTRAVENOUS | Status: AC
  Administered 2015-01-30: 3.375 g via INTRAVENOUS

## 2015-01-30 MED ORDER — SODIUM CHLORIDE 0.9 % IJ SOLN
3.0000 mL | Freq: Two times a day (BID) | INTRAMUSCULAR | Status: DC
  Administered 2015-01-31 – 2015-02-05 (×9): 3 mL via INTRAVENOUS

## 2015-01-30 MED ORDER — LORAZEPAM 2 MG/ML IJ SOLN: 1.0000 mg | Freq: Four times a day (QID) | INTRAMUSCULAR | Status: DC | PRN

## 2015-01-30 MED ORDER — PIPERACILLIN-TAZOBACTAM 3.375 G IVPB 30 MIN
3.3750 g | Freq: Three times a day (TID) | INTRAVENOUS | Status: DC
  Filled 2015-01-30 (×3): qty 50

## 2015-01-30 MED ORDER — MORPHINE SULFATE 2 MG/ML IJ SOLN
1.0000 mg | INTRAMUSCULAR | Status: DC | PRN
  Administered 2015-02-01 – 2015-02-06 (×9): 1 mg via INTRAVENOUS
  Filled 2015-01-30 (×9): qty 1

## 2015-01-30 MED ORDER — PREDNISONE 10 MG PO TABS
ORAL_TABLET | ORAL | Status: AC
  Filled 2015-01-30: qty 3

## 2015-01-30 MED ORDER — PANTOPRAZOLE SODIUM 40 MG IV SOLR
40.0000 mg | Freq: Two times a day (BID) | INTRAVENOUS | Status: DC
  Administered 2015-01-30 – 2015-02-06 (×14): 40 mg via INTRAVENOUS
  Filled 2015-01-30 (×15): qty 40

## 2015-01-30 MED ORDER — LEVETIRACETAM 500 MG PO TABS
500.0000 mg | ORAL_TABLET | Freq: Two times a day (BID) | ORAL | Status: DC
  Administered 2015-01-30 – 2015-02-02 (×6): 500 mg via ORAL
  Filled 2015-01-30 (×8): qty 1

## 2015-01-30 MED ORDER — ADULT MULTIVITAMIN W/MINERALS CH
1.0000 | ORAL_TABLET | Freq: Every day | ORAL | Status: DC
  Administered 2015-01-30 – 2015-01-31 (×2): 1 via ORAL
  Filled 2015-01-30 (×2): qty 1

## 2015-01-30 MED ORDER — SODIUM CHLORIDE 0.9 % IV SOLN
INTRAVENOUS | Status: DC
  Administered 2015-01-30 – 2015-02-03 (×7): via INTRAVENOUS
  Administered 2015-02-04: 100 mL/h via INTRAVENOUS
  Administered 2015-02-05: 04:00:00 via INTRAVENOUS
  Administered 2015-02-05: 50 mL/h via INTRAVENOUS

## 2015-01-30 NOTE — ED Notes (Signed)
Patient is resting quietly. Family continues to address need for pain medication. Have explained that since patient is sleeping and quietly snoring, we will continue to monitor and address medication as needed.

## 2015-01-30 NOTE — ED Notes (Signed)
Patient transported to CT 

## 2015-01-30 NOTE — Plan of Care (Signed)
Problem: Discharge Progression Outcomes Goal: Discharge plan in place and appropriate Outcome: Progressing Patient is alert but sleepy and confused as to time Patient goes by [place] IslandsSonya Niece is at bedside Goal: Pain controlled with appropriate interventions Outcome: Progressing Patient had no c/o pain this shift Goal: Hemodynamically stable Outcome: Progressing VSS Goal: Complications resolved/controlled Outcome: Progressing Patient admitted late in shift, IV ABX given in ED     Goal: Tolerating diet Outcome: Progressing Patient was sleepy and did not want to eat as of shift change  Goal: Activity appropriate for discharge plan Outcome: Progressing [facility] Goal: Other Discharge Outcomes/Goals Outcome: Progressing Patient expects discharge back to home

## 2015-01-30 NOTE — H&P (Signed)
[facility] -  at [facility]   PATIENT NAME: Crystal [name]    MR#:  [id number]  DATE OF BIRTH:  Nov 01, 1970  DATE OF ADMISSION:  01/30/2015  PRIMARY CARE PHYSICIAN: Barbette Reichmann, MD   REQUESTING/REFERRING PHYSICIAN: Toney Rakes  CHIEF COMPLAINT:   Chief Complaint  Patient presents with  . Abdominal Pain    HISTORY OF PRESENT ILLNESS: Crystal [name]  is a 44 y.o. female with a known history of alcohol abuse as well as history of clipped brain aneurysm, pancreatitis, alcohol withdrawal seizures who presents to the ED with complaint of abdominal pain and abdominal distention as well as jaundice. Patient unable to provide any history her husband at bedside who is providing the history.The patient's husband at bedside reports that for the past several days the patient is having difficulty with "motor skills". Specifically she is having difficulty picking up objects with her hands (she is trembling when she picks up a drink and it spills) because she is having tremors. She is also having abdominal distention/bloating. Her skin has become increasingly yellow and she has had confusion. She reports that last week she had several episodes of bloody emesis but has not seen any blood in her vomit for the past 3 day. Patient also reports low-grade temperatures. She also stopped drinking 4 days ago. She was evaluated in the ER and was noted to have elevated WBC count, Ona level that is high. Ultrasound of the abdomen shows cirrhosis with portal venous hypertension splenomegaly and small amount of abdominal ascites is noted. The ED physician discussed the case with Dr. Filbert Schilder who recommended admission. Patient currently is drowsy and unable to provide me with any type of review of systems      PAST MEDICAL HISTORY:   Past Medical History  Diagnosis Date  . Cirrhosis   . Brain aneurysm   . Pancreatitis   . Bipolar 1 disorder   . ADHD (attention deficit hyperactivity  disorder)   . Anxiety   . Seizures   . Suicide attempt   . COPD (chronic obstructive pulmonary disease)   . GERD (gastroesophageal reflux disease)     PAST SURGICAL HISTORY:  Past Surgical History  Procedure Laterality Date  . Brain surgery    . Laproscopy      SOCIAL HISTORY:  History  Substance Use Topics  . Smoking status: Current Every Day Smoker -- 0.50 packs/day  . Smokeless tobacco: Never Used  . Alcohol Use: 18.0 oz/week    30 Standard drinks or equivalent per week     Comment: daily    FAMILY HISTORY:  Family History  Problem Relation Age of Onset  . Hypertension      DRUG ALLERGIES:  Allergies  Allergen Reactions  . Oxycontin [Oxycodone Hcl] Itching  . Codeine Itching    Other reaction(s): Unknown    REVIEW OF SYSTEMS:   Limited due to patient being very sleepy and lethargic   MEDICATIONS AT HOME:  Prior to Admission medications   Medication Sig Start Date End Date Taking? Authorizing Provider  albuterol (VENTOLIN HFA) 108 (90 BASE) MCG/ACT inhaler Inhale 2 puffs into the lungs every 6 (six) hours as needed. For wheezing 07/20/14  Yes Historical Provider, MD  clonazePAM (KLONOPIN) 1 MG tablet Take 1 mg by mouth 2 (two) times daily. 01/08/15  Yes Historical Provider, MD  docusate sodium (STOOL SOFTENER) 100 MG capsule Take 200 mg by mouth 2 (two) times daily.   Yes Historical Provider, MD  gabapentin (NEURONTIN)  600 MG tablet Take 600 mg by mouth 2 (two) times daily. 01/23/15  Yes Historical Provider, MD  ipratropium-albuterol (DUONEB) 0.5-2.5 (3) MG/3ML SOLN Inhale 3 mLs into the lungs every 6 (six) hours as needed. For wheezing   Yes Historical Provider, MD  lansoprazole (PREVACID) 30 MG capsule Take 30 mg by mouth daily.   Yes Historical Provider, MD  levETIRAcetam (KEPPRA) 500 MG tablet Take 500 mg by mouth 2 (two) times daily. 01/19/15  Yes Historical Provider, MD  oxyCODONE (OXY IR/ROXICODONE) 5 MG immediate release tablet Take 5 mg by mouth 2 (two)  times daily. 01/05/15  Yes Historical Provider, MD  promethazine (PHENERGAN) 25 MG tablet Take 25 mg by mouth 3 (three) times daily as needed. For nausea and vomiting. 11/30/14  Yes Historical Provider, MD  SEROQUEL XR 150 MG 24 hr tablet Take 150 mg by mouth at bedtime. 12/29/14  Yes Historical Provider, MD  spironolactone (ALDACTONE) 50 MG tablet Take 50 mg by mouth 2 (two) times daily. 01/04/15 01/04/16 Yes Historical Provider, MD      PHYSICAL EXAMINATION:   VITAL SIGNS: Blood pressure 97/62, pulse 107, temperature 97.8 F (36.6 C), temperature source Oral, resp. rate 16, height 5\' 3"  (1.6 m), weight 67.132 kg (148 lb), SpO2 94 %.  GENERAL:  44 y.o.-year-old vertically ill-appearing very jaundice and lethargic  EYES: Pupils equal, round, reactive to light and accommodation. + scleral icterus. Extraocular muscles intact.  HEENT: Head atraumatic, normocephalic. Oropharynx and nasopharynx clear.  NECK:  Supple, no jugular venous distention. No thyroid enlargement, no tenderness.  LUNGS: Normal breath sounds bilaterally, no wheezing, rales,rhonchi or crepitation. No use of accessory muscles of respiration.  CARDIOVASCULAR: S1, S2 normal. No murmurs, rubs, or gallops.  ABDOMEN: Soft, distended. Bowel sounds present. No organomegaly or mass.  EXTREMITIES: No pedal edema, cyanosis, or clubbing.  NEUROLOGIC: Lethargic  PSYCHIATRIC: The patient is lethargic SKIN: Jaundice, and petechiae rash all over  LABORATORY PANEL:   CBC  Recent Labs Lab 01/30/15 1155  WBC 23.3*  HGB 11.3*  HCT 33.0*  PLT 180  MCV 101.5*  MCH 34.6*  MCHC 34.1  RDW 16.3*  LYMPHSABS 2.3  MONOABS 3.0*  EOSABS 0.9*  BASOSABS 0.0   ------------------------------------------------------------------------------------------------------------------  Chemistries   Recent Labs Lab 01/30/15 1155  NA 128*  K 4.8  CL 93*  CO2 23  GLUCOSE 133*  BUN 24*  CREATININE SEE COMMENTS  CALCIUM 8.4*  AST 395*  ALT 202*   ALKPHOS 175*  BILITOT 26.6*   ------------------------------------------------------------------------------------------------------------------ CrCl cannot be calculated (Patient has no serum creatinine result on file.). ------------------------------------------------------------------------------------------------------------------ No results for input(s): TSH, T4TOTAL, T3FREE, THYROIDAB in the last 72 hours.  Invalid input(s): FREET3   Coagulation profile  Recent Labs Lab 01/30/15 1600  INR 2.39   ------------------------------------------------------------------------------------------------------------------- No results for input(s): DDIMER in the last 72 hours. -------------------------------------------------------------------------------------------------------------------  Cardiac Enzymes  Recent Labs Lab 01/30/15 1155  TROPONINI 0.07*   ------------------------------------------------------------------------------------------------------------------ Invalid input(s): POCBNP  ---------------------------------------------------------------------------------------------------------------  Urinalysis    Component Value Date/Time   COLORURINE AMBER* 01/30/2015 1241   APPEARANCEUR CLOUDY* 01/30/2015 1241   LABSPEC 1.019 01/30/2015 1241   PHURINE 5.0 01/30/2015 1241   GLUCOSEU 50* 01/30/2015 1241   HGBUR 1+* 01/30/2015 1241   BILIRUBINUR 2+* 01/30/2015 1241   KETONESUR NEGATIVE 01/30/2015 1241   PROTEINUR 30* 01/30/2015 1241   NITRITE NEGATIVE 01/30/2015 1241   LEUKOCYTESUR NEGATIVE 01/30/2015 1241     RADIOLOGY: Ct Head Wo Contrast  01/30/2015   CLINICAL DATA:  Loss of  motor skills over the last cerebral days. Fevers. Mental status changes. History of aneurysm.  EXAM: CT HEAD WITHOUT CONTRAST  TECHNIQUE: Contiguous axial images were obtained from the base of the skull through the vertex without intravenous contrast.  COMPARISON:  10/27/2014 and multiple  previous  FINDINGS: Previous right-sided pterional craniotomy. No aneurysm clip is visible by CT. The brain shows mild volume loss without evidence of old or acute focal infarction, mass lesion, hemorrhage, hydrocephalus or extra-axial collection. No acute calvarial finding. Sinuses, middle ears and mastoids are clear.  IMPRESSION: No acute or reversible finding. Previous pterional craniotomy on the right. Mild brain atrophy without focal finding.   Electronically Signed   By: Paulina Fusi M.D.   On: 01/30/2015 13:00   US Abdomen Complete  01/30/2015   CLINICAL DATA:  Abdominal pain. Evaluate biliary tract and evaluate for ascites. Cirrhosis. Pancreatitis.  EXAM: ULTRASOUND ABDOMEN COMPLETE  COMPARISON:  CT of 08/30/2014  FINDINGS: Gallbladder: Minimal gallbladder sludge. Gallbladder wall thickening, 8 mm. Pericholecystic fluid. Sonographic Murphy's sign was not elicited.  Common bile duct: Diameter: Normal, 5 mm.  Liver: Moderate cirrhosis.  No focal liver lesion.  IVC: No abnormality visualized.  Pancreas: Not visualized due to overlying bowel gas.  Spleen: Splenomegaly, with the splenic volume of 480 cc.  Right Kidney: Length: 10.0 cm. Echogenicity within normal limits. No mass or hydronephrosis visualized.  Left Kidney: Length: 10.7 cm. Echogenicity within normal limits. No mass or hydronephrosis visualized.  Abdominal aorta: No aneurysm visualized.  Other findings: Small volume ascites, primarily identified adjacent the liver.  Heterogeneous flow within the portal vein, partially hepatofugal.  Mild degradation secondary to overlying bowel gas.  IMPRESSION: 1. Cirrhosis with portal venous hypertension, splenomegaly, and heterogeneous portal venous flow. 2. Gallbladder sludge with nonspecific gallbladder wall thickening (in the setting of portal venous hypertension) and pericholecystic fluid. No sonographic Murphy sign to confirm acute cholecystitis. 3. Small volume abdominal ascites. 4. Mild limitations  secondary to overlying bowel gas.   Electronically Signed   By: Jeronimo Greaves M.D.   On: 01/30/2015 15:07   Dg Chest Portable 1 View  01/30/2015   CLINICAL DATA:  Generalize weakness. History of brain aneurysm. Smoker. Cirrhosis.  EXAM: PORTABLE CHEST - 1 VIEW  COMPARISON:  10/27/2014  FINDINGS: Midline trachea. Normal heart size. Mild right hemidiaphragm elevation. No pleural effusion or pneumothorax. New patchy right worse than left lower lung airspace disease.  IMPRESSION: New bibasilar opacities, favored to represent atelectasis. Early pneumonia felt less likely. Consider radiographic followup at 1-2 days.   Electronically Signed   By: Jeronimo Greaves M.D.   On: 01/30/2015 13:05    EKG: Orders placed or performed during the hospital encounter of 01/30/15  . ED EKG  . ED EKG    IMPRESSION AND PLAN: Patient is a 44 year old white female with alcohol abuse brought in with abdominal pain and lethargy  1. Acute encephalopathy; due to hepatic encephalopathy: We'll treat her with lactulose, monitor mental status  2. Acute alcoholic liver failure: GI consult, prednisone 40 mg daily  3. Elevated WBC count, possible sepsis unclear source, pneumonia is a possibility, treat with broad-spectrum anabiotic's with IV vancomycin and Zosyn follow cultures  4. Elevated troponin; likely due to demand ischemia and her liver failure  5. COPD; no evidence of exacerbation: Continue nebulizers as needed  6. Bipolar disorder we'll hold her clonazepam in setting of her altered mental status  7. GERD and a possibility of having a recent GI bleed I'll place her  on Protonix twice a day  8. Seizure disorder continue Keppra as taking at home  9. Miscellaneous patient will be on SCDs for DVT prophylaxis   All the records are reviewed and case discussed with ED provider. Management plans discussed with the patient, family and they are in agreement.  CODE STATUS: Full Advance Directive Documentation        Most  Recent Value   Type of Advance Directive  Healthcare Power of Attorney   Pre-existing out of facility DNR order (yellow form or pink MOST form)     "MOST" Form in Place?         TOTAL TIME TAKING CARE OF THIS PATIENT: 55 minutes of critical care time    Danarius Mcconathy M.D on 01/30/2015 at 4:25 PM  Between 7am to 6pAuburn Bilberrym - Pager - [phone]  After 6pm go to [email or web address] - password [email or web address] [facility]  [facility]  Office  [phone]  CC: Primary care physician; Barbette ReichmannHANDE,VISHWANATH, MD

## 2015-01-30 NOTE — ED Notes (Signed)
Pts husband states she has lost her motor skills over the past few days, has had this happen in the past when she had pneumonia, abd appears swollen, pt has cirrhosis of the liver, some jaundice noted. Pt states she is in a lot of abd pain. Fevers the past few days as well.

## 2015-01-30 NOTE — Progress Notes (Signed)
ANTIBIOTIC CONSULT NOTE - INITIAL  Pharmacy Consult for Vancomycin Indication: sepsis Allergies  Allergen Reactions  . Oxycontin [Oxycodone Hcl] Itching  . Codeine Itching    Other reaction(s): Unknown    Patient Measurements: Height: 5\' 3"  (160 cm) Weight: 148 lb (67.132 kg) IBW/kg (Calculated) : 52.4 Adjusted Body Weight:   Vital Signs: Temp: 98.3 F (36.8 C) (07/04 1724) Temp Source: Oral (07/04 1724) BP: 146/99 mmHg (07/04 1724) Pulse Rate: 109 (07/04 1724) Intake/Output from previous day:   Intake/Output from this shift:    Labs:  Recent Labs  01/30/15 1155  WBC 23.3*  HGB 11.3*  PLT 180  CREATININE 2.98*   Estimated Creatinine Clearance: 22.9 mL/min (by C-G formula based on Cr of 2.98). No results for input(s): VANCOTROUGH, VANCOPEAK, VANCORANDOM, GENTTROUGH, GENTPEAK, GENTRANDOM, TOBRATROUGH, TOBRAPEAK, TOBRARND, AMIKACINPEAK, AMIKACINTROU, AMIKACIN in the last 72 hours.   Microbiology: No results found for this or any previous visit (from the past 720 hour(s)).  Medical History: Past Medical History  Diagnosis Date  . Cirrhosis   . Brain aneurysm   . Pancreatitis   . Bipolar 1 disorder   . ADHD (attention deficit hyperactivity disorder)   . Anxiety   . Seizures   . Suicide attempt   . COPD (chronic obstructive pulmonary disease)   . GERD (gastroesophageal reflux disease)     Medications:  Scheduled:  . lactulose  30 g Oral TID  . levETIRAcetam  500 mg Oral BID  . pantoprazole (PROTONIX) IV  40 mg Intravenous Q12H  . piperacillin-tazobactam  3.375 g Intravenous 3 times per day  . [START ON 01/31/2015] predniSONE  40 mg Oral Q breakfast  . QUEtiapine Fumarate  150 mg Oral QHS  . sodium chloride  3 mL Intravenous Q12H  . [START ON 02/01/2015] vancomycin  1,000 mg Intravenous Q36H   Assessment: Patient being treated empirically for leukocytosis with unclear source of infection. Per lab patient's serum creatinine was hard to quantify. Upon  dilution, serum creatinine resulted @ 2.98.   Goal of Therapy:  Vancomycin trough level 15-20 mcg/ml  Plan:   Patient had Vancomycin 1 g IV x 1 given @ ~1600 on 7/4. Based on patient's current PK parameters, patient would qualify for q36 hour dosing. Will dose per levels for now since patient is likely in AKI. Will order random vancomycin level @ 04:00 on 7/6 ~ 36 hours post dose. Will order Vancomycin 1g IV q36 hours as placeholder for now.   Follow up culture results  Arali Somera D 01/30/2015,8:12 PM

## 2015-01-30 NOTE — ED Notes (Signed)
Lab called with critical troponin value of 0.07. Dr. Inocencio HomesGayle notified.

## 2015-01-30 NOTE — Progress Notes (Signed)
On admission the patients husband Lacie ScottsDonald Polack and niece Hazle QuantChristine George were in  The patients room at time of admission.  Prior to obtaining patient information I asked the husband if I could discuss/ask questions  with everyone in the room.  Mr. Artist Beacholack stated I could discuss patient information in front of Hazle QuantChristine George and her husband.

## 2015-01-30 NOTE — ED Notes (Signed)
Patient transported to Ultrasound 

## 2015-01-30 NOTE — Consult Note (Signed)
GI Inpatient Consult Note Christena Deem MD  Reason for Consult: Cirrhosis/liver failure   Attending Requesting Consult: Dr Auburn Bilberry  Outpatient Primary Physician: Dr Cora Daniels, Dr Dow Adolph (GI)  History of Present Illness: Crystal [name] is a 44 y.o. female who was brought to the hospital today by her niece with altered mental status and marked jaundice. Patient has some amount of encephalopathy and the history is obtained from her niece Ms Hazle Quant. Ms Greggory Stallion states that he moved here about 3 months ago and is living with patient and patient's husband with her husband to help take care of Mrs. Picazo. Patient will occasionally contribute to history. Patient has a long history of marked alcohol abuse. Apparently over the past ever weeks to months she has been drinking quite heavily and stopped about 3 days ago. He has been drinking multiple "bootlegger" alcohol beverages daily. He has been a darkening of her urine as well as at times emesis with mostly clear spittle flecks of blood. As possibly been some blood in the stool at times. In the past several days though. She has had nausea and vomiting off and on for several weeks with a decreased appetite. He does have some heartburn at times as well. He had an EGD done on a hospitalization 11/29/2013 showing portal hypertensive gastropathy, grade 1 varices, grade C erosive esophagitis.  Patient has been taking medications with the assistance of her niece recognized most of the medications on her list.  Past Medical History:  Past Medical History  Diagnosis Date  . Cirrhosis   . Brain aneurysm   . Pancreatitis   . Bipolar 1 disorder   . ADHD (attention deficit hyperactivity disorder)   . Anxiety   . Seizures   . Suicide attempt   . COPD (chronic obstructive pulmonary disease)   . GERD (gastroesophageal reflux disease)     Problem List: Patient Active Problem List   Diagnosis Date Noted  . Hepatic encephalopathy  01/30/2015    Past Surgical History: Past Surgical History  Procedure Laterality Date  . Brain surgery    . Laproscopy      Allergies: Allergies  Allergen Reactions  . Oxycontin [Oxycodone Hcl] Itching  . Codeine Itching    Other reaction(s): Unknown    Home Medications: Prescriptions prior to admission  Medication Sig Dispense Refill Last Dose  . albuterol (VENTOLIN HFA) 108 (90 BASE) MCG/ACT inhaler Inhale 2 puffs into the lungs every 6 (six) hours as needed. For wheezing   01/29/2015 at Unknown time  . clonazePAM (KLONOPIN) 1 MG tablet Take 1 mg by mouth 2 (two) times daily.  0 01/29/2015 at Unknown time  . docusate sodium (STOOL SOFTENER) 100 MG capsule Take 200 mg by mouth 2 (two) times daily.   01/29/2015 at Unknown time  . gabapentin (NEURONTIN) 600 MG tablet Take 600 mg by mouth 2 (two) times daily.  0 01/29/2015 at Unknown time  . ipratropium-albuterol (DUONEB) 0.5-2.5 (3) MG/3ML SOLN Inhale 3 mLs into the lungs every 6 (six) hours as needed. For wheezing   01/29/2015 at Unknown time  . lansoprazole (PREVACID) 30 MG capsule Take 30 mg by mouth daily.   01/29/2015 at Unknown time  . levETIRAcetam (KEPPRA) 500 MG tablet Take 500 mg by mouth 2 (two) times daily.  0 01/29/2015 at Unknown time  . oxyCODONE (OXY IR/ROXICODONE) 5 MG immediate release tablet Take 5 mg by mouth 2 (two) times daily.  0 01/29/2015 at Unknown time  . promethazine (  PHENERGAN) 25 MG tablet Take 25 mg by mouth 3 (three) times daily as needed. For nausea and vomiting.  0 01/29/2015 at Unknown time  . SEROQUEL XR 150 MG 24 hr tablet Take 150 mg by mouth at bedtime.  0 01/29/2015 at Unknown time  . spironolactone (ALDACTONE) 50 MG tablet Take 50 mg by mouth 2 (two) times daily.   01/29/2015 at Unknown time   Home medication reconciliation was completed with the patient.   Scheduled Inpatient Medications:   . lactulose  30 g Oral TID  . levETIRAcetam  500 mg Oral BID  . pantoprazole (PROTONIX) IV  40 mg Intravenous Q12H  .  piperacillin-tazobactam  3.375 g Intravenous 3 times per day  . [START ON 01/31/2015] predniSONE  40 mg Oral Q breakfast  . QUEtiapine Fumarate  150 mg Oral QHS  . sodium chloride  3 mL Intravenous Q12H  . [START ON 02/01/2015] vancomycin  1,000 mg Intravenous Q36H    Continuous Inpatient Infusions:   . sodium chloride 100 mL/hr at 01/30/15 1922    PRN Inpatient Medications:  albuterol, ipratropium-albuterol, morphine injection, ondansetron **OR** ondansetron (ZOFRAN) IV  Family History: family history includes Hypertension in an other family member.   GI Family History: Not obtained  Social History:   reports that she has been smoking.  She has never used smokeless tobacco. She reports that she drinks about 18.0 oz of alcohol per week. She reports that she does not use illicit drugs. The patient denies ETOH, tobacco, or drug use.   ROS  Review of Systems: 10 systems reviewed per admission history and physical agree with same  Physical Examination: BP 146/99 mmHg  Pulse 109  Temp(Src) 98.3 F (36.8 C) (Oral)  Resp 18  Ht 5\' 3"  (1.6 m)  Wt 67.132 kg (148 lb)  BMI 26.22 kg/m2  SpO2 97% Gen: Markedly jaundiced 44 year old female obvious discomfort at times, lethargic. HEENT: Eyes show icterus normal cephalic. PERRLA Neck: No JVD Chest: Clear to auscultation CV: Mild Tachycardia without rub or gallop Abd: Mildly protuberant to moderately protuberant mild diffuse discomfort to palpation there is no rebound, bowel sounds are positive, Ext: No clubbing cyanosis or edema Skin: Warm, dry Other: Difficult to evaluate for asterixis due to mental status  Data: Lab Results  Component Value Date   WBC 23.3* 01/30/2015   HGB 11.3* 01/30/2015   HCT 33.0* 01/30/2015   MCV 101.5* 01/30/2015   PLT 180 01/30/2015    Recent Labs Lab 01/30/15 1155  HGB 11.3*   Lab Results  Component Value Date   NA 128* 01/30/2015   K 4.8 01/30/2015   CL 93* 01/30/2015   CO2 23 01/30/2015    BUN 24* 01/30/2015   CREATININE 2.98* 01/30/2015   Lab Results  Component Value Date   ALT 202* 01/30/2015   AST 395* 01/30/2015   ALKPHOS 175* 01/30/2015   BILITOT 26.6* 01/30/2015    Recent Labs Lab 01/30/15 1542 01/30/15 1600  APTT 52*  --   INR  --  2.39   CBC Latest Ref Rng 01/30/2015 10/27/2014 10/22/2014  WBC 3.6 - 11.0 K/uL 23.3(H) 5.9 10.8  Hemoglobin 12.0 - 16.0 g/dL 11.3(L) 13.5 15.1  Hematocrit 35.0 - 47.0 % 33.0(L) 41.1 45.2  Platelets 150 - 440 K/uL 180 56(L) 85(L)    STUDIES: Ct Head Wo Contrast  01/30/2015   CLINICAL DATA:  Loss of motor skills over the last cerebral days. Fevers. Mental status changes. History of aneurysm.  EXAM: CT HEAD  WITHOUT CONTRAST  TECHNIQUE: Contiguous axial images were obtained from the base of the skull through the vertex without intravenous contrast.  COMPARISON:  10/27/2014 and multiple previous  FINDINGS: Previous right-sided pterional craniotomy. No aneurysm clip is visible by CT. The brain shows mild volume loss without evidence of old or acute focal infarction, mass lesion, hemorrhage, hydrocephalus or extra-axial collection. No acute calvarial finding. Sinuses, middle ears and mastoids are clear.  IMPRESSION: No acute or reversible finding. Previous pterional craniotomy on the right. Mild brain atrophy without focal finding.   Electronically Signed   By: Paulina Fusi M.D.   On: 01/30/2015 13:00   US Abdomen Complete  01/30/2015   CLINICAL DATA:  Abdominal pain. Evaluate biliary tract and evaluate for ascites. Cirrhosis. Pancreatitis.  EXAM: ULTRASOUND ABDOMEN COMPLETE  COMPARISON:  CT of 08/30/2014  FINDINGS: Gallbladder: Minimal gallbladder sludge. Gallbladder wall thickening, 8 mm. Pericholecystic fluid. Sonographic Murphy's sign was not elicited.  Common bile duct: Diameter: Normal, 5 mm.  Liver: Moderate cirrhosis.  No focal liver lesion.  IVC: No abnormality visualized.  Pancreas: Not visualized due to overlying bowel gas.  Spleen:  Splenomegaly, with the splenic volume of 480 cc.  Right Kidney: Length: 10.0 cm. Echogenicity within normal limits. No mass or hydronephrosis visualized.  Left Kidney: Length: 10.7 cm. Echogenicity within normal limits. No mass or hydronephrosis visualized.  Abdominal aorta: No aneurysm visualized.  Other findings: Small volume ascites, primarily identified adjacent the liver.  Heterogeneous flow within the portal vein, partially hepatofugal.  Mild degradation secondary to overlying bowel gas.  IMPRESSION: 1. Cirrhosis with portal venous hypertension, splenomegaly, and heterogeneous portal venous flow. 2. Gallbladder sludge with nonspecific gallbladder wall thickening (in the setting of portal venous hypertension) and pericholecystic fluid. No sonographic Murphy sign to confirm acute cholecystitis. 3. Small volume abdominal ascites. 4. Mild limitations secondary to overlying bowel gas.   Electronically Signed   By: Jeronimo Greaves M.D.   On: 01/30/2015 15:07   Dg Chest Portable 1 View  01/30/2015   CLINICAL DATA:  Generalize weakness. History of brain aneurysm. Smoker. Cirrhosis.  EXAM: PORTABLE CHEST - 1 VIEW  COMPARISON:  10/27/2014  FINDINGS: Midline trachea. Normal heart size. Mild right hemidiaphragm elevation. No pleural effusion or pneumothorax. New patchy right worse than left lower lung airspace disease.  IMPRESSION: New bibasilar opacities, favored to represent atelectasis. Early pneumonia felt less likely. Consider radiographic followup at 1-2 days.   Electronically Signed   By: Jeronimo Greaves M.D.   On: 01/30/2015 13:05   @  Assessment: 1. Abnormal liver associated enzymes. Differential diagnosis would include alcoholic hepatitis as well as subacute liver failure, patient is quite ill. She does meet Maddrey criteria for the use of steroids and she has been started on prednisone. There is an elevated ammonia consistent with the apparent encephalopathy, and patient has been started on lactulose.  She has a coagulopathy with elevated pro time. Patient has an elevated BUN as well as creatinine possible dehydration versus early hepatorenal syndrome. She is also currently been placed on Zosyn and  vancomycin as IV anti-biotics. Agree with current management.  Recommendations: Continue current. We'll need daily liver enzymes and pro time.  Thank you for the consult. Please call with questions or concerns.  Christena Deem, MD  01/30/2015 8:05 PM

## 2015-01-30 NOTE — ED Provider Notes (Addendum)
[facility] Emergency Department Provider Note  ____________________________________________  Time seen: Approximately 12:15 PM  I have reviewed the triage vital signs and the nursing notes.   HISTORY  Chief Complaint Abdominal Pain    HPI Crystal [name] is a 44 y.o. female with history of cirrhosis, history of clipped brain aneurysm in 2004, pancreatitis, alcoholism who presents for evaluation of her generalized weakness, abdominal pain and distention as well as jaundice, gradual onset, constant x 1 week. The patient's husband at bedside reports that for the past several days the patient is having difficulty with "motor skills". Specifically she is having difficulty picking up objects with her hands (she is trembling when she picks up a drink and it spills) because she is having tremors. She is also having abdominal distention/bloating. Her skin has become increasingly yellow and she has had confusion. She reports that last week she had several episodes of bloody emesis but has not seen any blood in her vomit for the past 3 days. She reports "low-grade fever" last week with a maximum temperature 100 but no fevers since. She's had no cough, mild diarrhea. No falls. Currently her symptoms are severe. No modifying factors. She reports she stopped taking alcohol altogether 4 days ago.   Past Medical History  Diagnosis Date  . Cirrhosis   . Brain aneurysm   . Pancreatitis     There are no active problems to display for this patient.   Past Surgical History  Procedure Laterality Date  . Brain surgery      No current outpatient prescriptions on file.  Allergies Oxycontin  No family history on file.  Social History History  Substance Use Topics  . Smoking status: Current Every Day Smoker -- 0.50 packs/day  . Smokeless tobacco: Never Used  . Alcohol Use: Yes     Comment: daily    Review of Systems Constitutional: + fever/chills Eyes: No visual  changes. ENT: No sore throat. Cardiovascular: Denies chest pain. Respiratory: Denies shortness of breath. Gastrointestinal: + abdominal pain.  + nausea, + vomiting.  + diarrhea.  No constipation. Genitourinary: Negative for dysuria. Musculoskeletal: Negative for back pain. Skin: Negative for rash. Neurological: Negative for headaches, focal weakness or numbness.  10-point ROS otherwise negative.  ____________________________________________   PHYSICAL EXAM:  VITAL SIGNS: ED Triage Vitals  Enc Vitals Group     BP 01/30/15 1128 85/42 mmHg     Pulse Rate 01/30/15 1128 110     Resp 01/30/15 1128 18     Temp 01/30/15 1128 97.8 F (36.6 C)     Temp Source 01/30/15 1128 Oral     SpO2 01/30/15 1128 96 %     Weight 01/30/15 1128 148 lb (67.132 kg)     Height 01/30/15 1128 5\' 3"  (1.6 m)     Head Cir --      Peak Flow --      Pain Score 01/30/15 1146 10     Pain Loc --      Pain Edu? --      Excl. in GC? --     Constitutional: The patient appears sleepy but she arouses to light touch and voice and answers all questions appropriately, follows all commands, cooperates with the exam. Eyes: +scleral icterus.  PERRL. EOMI. pinpoint reactive pupils bilaterally. Head: Atraumatic. Nose: No congestion/rhinnorhea. Mouth/Throat: Mucous membranes are moist.  Oropharynx non-erythematous. Neck: No stridor.  Neck is supple without meningismus. Cardiovascular: Slightly tachycardic rate, regular rhythm. Grossly normal heart sounds.  Good peripheral circulation. Respiratory: Normal respiratory effort.  No retractions. Lungs CTAB. Gastrointestinal: Soft but distended with mild diffuse tenderness. No abdominal bruits. No CVA tenderness. Genitourinary: Deferred Musculoskeletal: No lower extremity tenderness nor edema.  No joint effusions. Neurologic:  Normal speech and language. No gross focal neurologic deficits are appreciated. Speech is normal. Skin:  Severe jaundice with diffuse blanching  petechial rash. Psychiatric: Mood and affect are normal. Speech and behavior are normal.  ____________________________________________   LABS (all labs ordered are listed, but only abnormal results are displayed)  Labs Reviewed  CBC WITH DIFFERENTIAL/PLATELET - Abnormal; Notable for the following:    WBC 23.3 (*)    RBC 3.25 (*)    Hemoglobin 11.3 (*)    HCT 33.0 (*)    MCV 101.5 (*)    MCH 34.6 (*)    RDW 16.3 (*)    Neutro Abs 17.1 (*)    Monocytes Absolute 3.0 (*)    Eosinophils Absolute 0.9 (*)    All other components within normal limits  COMPREHENSIVE METABOLIC PANEL - Abnormal; Notable for the following:    Sodium 128 (*)    Chloride 93 (*)    Glucose, Bld 133 (*)    BUN 24 (*)    Calcium 8.4 (*)    Albumin 2.3 (*)    AST 395 (*)    ALT 202 (*)    Alkaline Phosphatase 175 (*)    Total Bilirubin 26.6 (*)    All other components within normal limits  AMMONIA - Abnormal; Notable for the following:    Ammonia 109 (*)    All other components within normal limits  TROPONIN I - Abnormal; Notable for the following:    Troponin I 0.07 (*)    All other components within normal limits  LACTIC ACID, PLASMA - Abnormal; Notable for the following:    Lactic Acid, Venous 2.5 (*)    All other components within normal limits  LACTIC ACID, PLASMA - Abnormal; Notable for the following:    Lactic Acid, Venous 2.3 (*)    All other components within normal limits  URINALYSIS COMPLETEWITH MICROSCOPIC ([facility] ONLY) - Abnormal; Notable for the following:    Color, Urine AMBER (*)    APPearance CLOUDY (*)    Glucose, UA 50 (*)    Bilirubin Urine 2+ (*)    Hgb urine dipstick 1+ (*)    Protein, ur 30 (*)    Bacteria, UA FEW (*)    Squamous Epithelial / LPF 6-30 (*)    All other components within normal limits  ACETAMINOPHEN LEVEL - Abnormal; Notable for the following:    Acetaminophen (Tylenol), Serum <10 (*)    All other components within normal limits  PROTIME-INR - Abnormal;  Notable for the following:    Prothrombin Time 26.2 (*)    All other components within normal limits  CULTURE, BLOOD (ROUTINE X 2)  CULTURE, BLOOD (ROUTINE X 2)  URINE CULTURE  LIPASE, BLOOD  ETHANOL  HCG, QUANTITATIVE, PREGNANCY  SALICYLATE LEVEL  APTT  BILIRUBIN, DIRECT  TYPE AND SCREEN   ____________________________________________  EKG  ED ECG REPORT I, Gayla Doss, the attending physician, personally viewed and interpreted this ECG.   Date: 01/30/2015  EKG Time: 12:30  Rate: 105  Rhythm: sinus tachycardia  Axis: Normal  Intervals:none  ST&T Change: No acute ST segment elevation  ____________________________________________  RADIOLOGY  CXR IMPRESSION: New bibasilar opacities, favored to represent atelectasis. Early pneumonia felt less likely. Consider radiographic followup at  1-2 days.   CT head IMPRESSION: No acute or reversible finding. Previous pterional craniotomy on the right. Mild brain atrophy without focal finding.   US abdomen IMPRESSION: 1. Cirrhosis with portal venous hypertension, splenomegaly, and heterogeneous portal venous flow. 2. Gallbladder sludge with nonspecific gallbladder wall thickening (in the setting of portal venous hypertension) and pericholecystic fluid. No sonographic Murphy sign to confirm acute cholecystitis. 3. Small volume abdominal ascites. 4. Mild limitations secondary to overlying bowel gas. ____________________________________________   PROCEDURES  Procedure(s) performed: None  Critical Care performed: Yes, see critical care note(s). Total critical care time spent 40 minutes.  ____________________________________________   INITIAL IMPRESSION / ASSESSMENT AND PLAN / ED COURSE  Pertinent labs & imaging results that were available during my care of the patient were reviewed by me and considered in my medical decision making (see chart for details).  LI BOBO is a 44 y.o. female with history of  cirrhosis, history of clipped brain aneurysm in 2004, pancreatitis, alcoholism who presents for evaluation of her generalized weakness, abdominal pain and distention as well as jaundice, gradual onset, constant x 1 week. On exam, she is ill-appearing with severe jaundice, blanching petechial rash diffusely, abdominal distention. She has an intact gross neuro exam. She is mildly tachycardic and hypotensive. Labs notable for elevated lactate at 2.5, elevated ammonia at 109, severe leukocytosis, elevated troponin, elevated liver function tests as well as severe Tbili elevation. Discussed case with Dr. Marva Panda who has reviewed labs and ultrasound, reports low suspicion for choledocholithiasis, he reports clinical picture is most consistent with alcohol-related hepatopathy given patient's recent discontinuation of alcohol. I have given Vanc, Zosyn empirically, light IV fluids, will not aggressively hydrate given that she has cirrhosis, risk of third spacing.  According to Maddrey's discriminant function for alcoholic hepatitis, she may benefit from steroid therapy so will give prednisone. Lactulose ordered for elevated ammonia/treatment of alcoholic hepatitis. ASA given for NSTEMI. Discussed with hospitalist for admission. ____________________________________________   FINAL CLINICAL IMPRESSION(S) / ED DIAGNOSES  Final diagnoses:  NSTEMI (non-ST elevated myocardial infarction)  Abdominal distension  Alcoholic hepatitis without ascites  Hepatic encephalopathy  Acute liver failure      Gayla Doss, MD 01/30/15 1542  Gayla Doss, MD 01/30/15 1544

## 2015-01-30 NOTE — Progress Notes (Signed)
Dr. Sampson GoonFitzgerald paged for pain level. Only has morphine order, however patient is sleepy. Order oxycodone 5 mg q 6 hours as needed for moderate pain. Patient's ethanol level is 247, no CIWA levels. Dr. Sampson GoonFitzgerald will review and put in orders.

## 2015-01-30 NOTE — ED Notes (Signed)
Lab called with critical lactic acid of 2.5. Dr. Inocencio HomesGayle notified.

## 2015-01-31 LAB — COMPREHENSIVE METABOLIC PANEL
ALBUMIN: 2.1 g/dL — AB (ref 3.5–5.0)
ALT: 171 U/L — ABNORMAL HIGH (ref 14–54)
ANION GAP: 9 (ref 5–15)
AST: 287 U/L — ABNORMAL HIGH (ref 15–41)
Alkaline Phosphatase: 149 U/L — ABNORMAL HIGH (ref 38–126)
BUN: 26 mg/dL — ABNORMAL HIGH (ref 6–20)
CHLORIDE: 99 mmol/L — AB (ref 101–111)
CO2: 21 mmol/L — AB (ref 22–32)
Calcium: 7.9 mg/dL — ABNORMAL LOW (ref 8.9–10.3)
GLUCOSE: 115 mg/dL — AB (ref 65–99)
POTASSIUM: 5.5 mmol/L — AB (ref 3.5–5.1)
SODIUM: 129 mmol/L — AB (ref 135–145)
TOTAL PROTEIN: 6.4 g/dL — AB (ref 6.5–8.1)
Total Bilirubin: 24 mg/dL (ref 0.3–1.2)

## 2015-01-31 LAB — CBC
HCT: 32.3 % — ABNORMAL LOW (ref 35.0–47.0)
HEMOGLOBIN: 11 g/dL — AB (ref 12.0–16.0)
MCH: 34.3 pg — AB (ref 26.0–34.0)
MCHC: 34 g/dL (ref 32.0–36.0)
MCV: 101 fL — ABNORMAL HIGH (ref 80.0–100.0)
PLATELETS: 183 10*3/uL (ref 150–440)
RBC: 3.2 MIL/uL — AB (ref 3.80–5.20)
RDW: 15.8 % — ABNORMAL HIGH (ref 11.5–14.5)
WBC: 21.7 10*3/uL — AB (ref 3.6–11.0)

## 2015-01-31 LAB — PROTIME-INR
INR: 2.37
PROTHROMBIN TIME: 26 s — AB (ref 11.4–15.0)

## 2015-01-31 LAB — TROPONIN I: Troponin I: <0.03 ng/mL (ref <0.031)

## 2015-01-31 LAB — SODIUM, URINE, RANDOM: Sodium, Ur: <10 mmol/L

## 2015-01-31 LAB — MRSA PCR SCREENING: MRSA BY PCR: NEGATIVE

## 2015-01-31 MED ORDER — NOREPINEPHRINE 4 MG/250ML-% IV SOLN: 0.0000 ug/min | INTRAVENOUS | Status: DC

## 2015-01-31 MED ORDER — NOREPINEPHRINE BITARTRATE 1 MG/ML IV SOLN: 0.0000 ug/min | INTRAVENOUS | Status: DC

## 2015-01-31 MED ORDER — BOOST / RESOURCE BREEZE PO LIQD
1.0000 | Freq: Three times a day (TID) | ORAL | Status: DC
  Administered 2015-01-31 – 2015-02-02 (×7): 1 via ORAL

## 2015-01-31 MED ORDER — SODIUM CHLORIDE 0.9 % IV BOLUS (SEPSIS)
250.0000 mL | Freq: Once | INTRAVENOUS | Status: AC
  Administered 2015-01-31: 250 mL via INTRAVENOUS

## 2015-01-31 MED ORDER — LORAZEPAM 2 MG/ML IJ SOLN
2.0000 mg | INTRAMUSCULAR | Status: DC | PRN
  Administered 2015-01-31 – 2015-02-02 (×6): 2 mg via INTRAVENOUS
  Administered 2015-02-02: 1 mg via INTRAVENOUS
  Administered 2015-02-02 – 2015-02-03 (×4): 2 mg via INTRAVENOUS
  Administered 2015-02-03 (×3): 3 mg via INTRAVENOUS
  Administered 2015-02-04 (×3): 2 mg via INTRAVENOUS
  Filled 2015-01-31 (×4): qty 1
  Filled 2015-01-31: qty 2
  Filled 2015-01-31: qty 1
  Filled 2015-01-31: qty 2
  Filled 2015-01-31 (×5): qty 1
  Filled 2015-01-31: qty 2
  Filled 2015-01-31 (×3): qty 1
  Filled 2015-01-31: qty 2
  Filled 2015-01-31 (×2): qty 1

## 2015-01-31 MED ORDER — ALBUMIN HUMAN 25 % IV SOLN
25.0000 g | Freq: Three times a day (TID) | INTRAVENOUS | Status: DC
  Administered 2015-01-31 – 2015-02-06 (×18): 25 g via INTRAVENOUS
  Filled 2015-01-31 (×21): qty 100

## 2015-01-31 NOTE — Progress Notes (Signed)
Initial Nutrition Assessment  INTERVENTION:  Medical Food Supplement Therapy: will recommend Boost Breeze TID for additional nutrition; Each Boost Breeze provides 250kcals and 9g protein   NUTRITION DIAGNOSIS:  Inadequate oral intake related to altered GI function, acute illness as evidenced by per patient/family report.  GOAL:   (Diet advancement and tolerance as medically able)  MONITOR:   (Energy Intake, Digestive system, Anthropometrics, Hepatic Profile, Electrolyte and renal Profile)  REASON FOR ASSESSMENT:  Malnutrition Screening Tool    ASSESSMENT:  Pt admitted with abdominal pain and hematemesis with hepatic encephalopathy. Pt jaundiced with ascites present. Per MD note pt with EtOH use; per family pt drinking 5-8 'bootleggers' daily PTA until 4 days ago. RD notes pt recent transfer to ICU for closer monitoring of blood pressure. PMHx:  Past Medical History  Diagnosis Date  . Cirrhosis   . Brain aneurysm   . Pancreatitis   . Bipolar 1 disorder   . ADHD (attention deficit hyperactivity disorder)   . Anxiety   . Seizures   . Suicide attempt   . COPD (chronic obstructive pulmonary disease)   . GERD (gastroesophageal reflux disease)     Diet Order:  Diet clear liquid Room service appropriate?: Yes; Fluid consistency:: Thin  Current Nutrition: Pt taking sips of juice this am on visit.  Food/Nutrition-Related History: Pt niece and her husband reports pt was eating bites of meals for the past week and drinking 'bootlegger' alcohol regularly (5-8 a day), however that pt had not had any for the past 5 days. Niece reports they recently moved from [place] to help her and that the niece was making meat and vegetables at meal times regularly, but pt ate grapes and popsicles mostly.   Medications: folic acid, ativan, protonix, MVI, prednisone, thiamine, NS at 127m/hr  Electrolyte/Renal Profile and Glucose Profile:   Recent Labs Lab 01/30/15 1155 01/31/15 0406  NA  128* 129*  K 4.8 5.5*  CL 93* 99*  CO2 23 21*  BUN 24* 26*  CREATININE 2.98* NOT VALID  CALCIUM 8.4* 7.9*  GLUCOSE 133* 115*   Protein Profile:  Recent Labs Lab 01/30/15 1155 01/31/15 0406  ALBUMIN 2.3* 2.1*   Nutritional Anemia Profile:  CBC Latest Ref Rng 01/31/2015 01/30/2015 10/27/2014  WBC 3.6 - 11.0 K/uL 21.7(H) 23.3(H) 5.9  Hemoglobin 12.0 - 16.0 g/dL 11.0(L) 11.3(L) 13.5  Hematocrit 35.0 - 47.0 % 32.3(L) 33.0(L) 41.1  Platelets 150 - 440 K/uL 183 180 56(L)   Hepatic Function Latest Ref Rng 01/31/2015 01/30/2015 10/27/2014  Total Protein 6.5 - 8.1 g/dL 6.4(L) 6.7 6.9  Albumin 3.5 - 5.0 g/dL 2.1(L) 2.3(L) 3.0(L)  AST 15 - 41 U/L 287(H) 395(H) 183(H)  ALT 14 - 54 U/L 171(H) 202(H) 47  Alk Phosphatase 38 - 126 U/L 149(H) 175(H) 281(H)  Total Bilirubin 0.3 - 1.2 mg/dL 24.0(HH) 26.6(HH) -  Bilirubin, Direct 0.1 - 0.5 mg/dL - 16.4(H) -   Gastrointestinal Profile: Last BM: 7/2   Nutrition-Focused Physical Exam Findings: Nutrition-Focused physical exam completed. Findings are WDL for fat depletion, muscle depletion, and edema.    Weight Change: Pt family reports weight has fluctuated up and down by 5-10lbs PTA Anthropometrics:   Height:  Ht Readings from Last 1 Encounters:  01/30/15 5' 3"  (1.6 m)    Weight:  Wt Readings from Last 1 Encounters:  01/30/15 148 lb (67.132 kg)     Wt Readings from Last 10 Encounters:  01/30/15 148 lb (67.132 kg)    BMI:  Body mass index is  26.22 kg/(m^2).  Estimated Nutritional Needs:  Kcal:  1722-2034kcals, BEE: 1304kcal, TEE: (IF 1.1-1.3)(AF 1.2)   Protein:  67-80g protein (1.0-1.2g/kg)  Fluid:  1675-2092m of fluid (25-326mkg)  Skin:   WDL except jaundiced  EDUCATION NEEDS:  Education needs no appropriate at this time   HILake PlacidRD, LDN Pager ([phone]

## 2015-01-31 NOTE — Progress Notes (Signed)
Patient continues to be weak, hypotensive, and lethargic.  Pressure did not improve much after 250 ml/ bolus.  Dr. Marcello FennelHande ordered to transfer to CCU 3 to be monitored a littler closer.  Patient transferred. Report called to [place].

## 2015-01-31 NOTE — Clinical Social Work Note (Signed)
Clinical Social Work Assessment  Patient Details  Name: Crystal [name] MRN: [id number] Date of Birth: 04/06/1971  Date of referral:  01/31/15               Reason for consult:  Abuse/Neglect                Permission sought to share information with:  Family Supports Permission granted to share information::  Yes, Verbal Permission Marshell Levan, niece.    Housing/Transportation Living arrangements for the past 2 months:  [place] of Information:  Patient, Other (Comment Required) (Niece) Patient Interpreter Needed:  None Criminal Activity/Legal Involvement Pertinent to Current Situation/Hospitalization:  No - Comment as needed Significant Relationships:  Other Family Members, Spouse Lives with:  Spouse Do you feel safe going back to the place where you live?  Yes Need for family participation in patient care:  Yes (Comment)  Care giving concerns:  Current substance abuse. Concerns for enabling by husband.    Social Worker assessment / plan:  CSW met with pt independently, after asking husband to leave the room, to address consult. Pt was transferred to ICU from 1C (medical floor) due to hypotension. Pt was admitted an placed on CIWA protocol due to current ETOH use. Per ICU Charge RN, pt has not started DTs. CSW introduced herself and explained role of social work. CSW also explained the nature of the consult. Pt is difficult to arouse and engage. Pt did not want to be alone when CSW asked questions and requested the Charge RN and RN be present when CSW completed assessment.   Pt is oriented to self and knows that she is in the hospital and the hospital is a [facility] facility. Pt was unable to elaborate. When asked what brought her into the hospital, pt stated that she was "sick and tired of wanting to die." Pt did not endorse SI at this time. Per H&P, pt has a history of mental health diagnosis and suicide attempt. Pt's speech is not clear and got agitated when asked to  repeat herself. Pt stated that she drinks 4-5 pints of "bootleggers" a day. Pt reported that her last drink was yesterday, prior coming to the hospital. Pt lives with her husband, whom she has been married to since October 14th, however does not like him. Pt does not have transportation and stays in the house all day.   Pt's niece entered the room, pt wanted her to stay. Pt granted verbal permission to speak with niece. Pt's niece, she would like POA, when this was stated in front of pt which pt agreed and said "I'll sign it." Pt's niece shared that pt may be divorcing her husband soon. Pt's niece also stated that pt's husband is an Firefighter with pt's drinking.   This assessment will continue. It is unclear, at time of note, if pt would like to stop drinking. CSW will complete an SBIRT to get a clearer picture of this. Pt shared that she is agreeable to having CSW follow up tomorrow.   CSW spoke with MD, and requested a psych consult. CSW will continue to follow.   Employment status:  Unemployed Forensic scientist:  Self Pay (Medicaid Pending) PT Recommendations:  Not assessed at this time Information / Referral to community resources:  Other (Comment Required) (None at time of assessment. Will do so prior to signing off.)  Patient/Family's Response to care:  Pt's niece was appreciative of CSW support.   Patient/Family's Understanding of and  Emotional Response to Diagnosis, Current Treatment, and Prognosis:  Pt's niece stated that pt has committed to stop drinking as she has "given herself to the [place]." More education around substance abuse may be needed.   Emotional Assessment Appearance:  Appears older than stated age, Disheveled Attitude/Demeanor/Rapport:  Angry, Hostile, Inconsistent Affect (typically observed):  Agitated Orientation:  Oriented to Self, Oriented to Place Alcohol / Substance use:  Alcohol Use Psych involvement (Current and /or in the community):  Yes (Comment) (CSW  requested Psych Consult)  Discharge Needs  Concerns to be addressed:  Substance Abuse Concerns, Decision making concerns, Adjustment to Illness Readmission within the last 30 days:  No Current discharge risk:  Other (Substance Abuse) Barriers to Discharge:  Inadequate or no insurance, Active Substance Use   Darden Dates, LCSW 01/31/2015, 4:59 PM

## 2015-01-31 NOTE — Progress Notes (Signed)
Pt co urgency but unable to void, bladder scan revealed 781 ml. Received order to place foley catheter from Dr. Marcello FennelHande. Also received a verbal order to start a 250 mL bolus due to hypotension. Dr. Marcello FennelHande also gave verbal order to place a PICC if needed to start Levophed.   At this time pt is slow to orient to time. The family reports her last drink being 4 days ago. The pt reported to the social worker that her last drink was yesterday and stated she drinks 4-5 pints of bootlegger a day. She also expressed no desire to quit drinking to the Child psychotherapistsocial worker.

## 2015-01-31 NOTE — Consult Note (Signed)
[facility] CONSULT NOTE    Date: 01/31/2015                  Patient Name:  Crystal [name]  MRN: [id number]  DOB: April 04, 1971  Age / Sex: 44 y.o., female         PCP: Barbette Reichmann, MD                 Service Requesting Consult: Dr. Marva Panda                 Reason for Consult: Acute renal failure            History of Present Illness: Patient is a 44 y.o. female with a PMHx of cirrhosis of the liver, cerebral aneurysm, pancreatitis, bipolar disorder, seizure disorder, COPD, GERD, history of suicide attempt, who was admitted to [facility] on 01/30/2015 for evaluation of abdominal pain. The patient currently has hepatic encephalopathy and is unable to offer any history. We are asked to see the patient for evaluation management of acute renal failure. The patient's baseline creatinine appears to be 0.45. Upon presentation creatinine was noted as being 2.98. On most recent metabolic profile creatinine could not be calculated secondary to interference. The patient has elevated AST and ALT and total bilirubin is quite elevated at 26.6.  Urinalysis was negative for significant proteinuria or hematuria. Urine sodium was also quite low at less than 10. There has been some concern for hepatorenal syndrome.   Medications: Outpatient medications: Prescriptions prior to admission  Medication Sig Dispense Refill Last Dose  . albuterol (VENTOLIN HFA) 108 (90 BASE) MCG/ACT inhaler Inhale 2 puffs into the lungs every 6 (six) hours as needed. For wheezing   01/29/2015 at Unknown time  . clonazePAM (KLONOPIN) 1 MG tablet Take 1 mg by mouth 2 (two) times daily.  0 01/29/2015 at Unknown time  . docusate sodium (STOOL SOFTENER) 100 MG capsule Take 200 mg by mouth 2 (two) times daily.   01/29/2015 at Unknown time  . gabapentin (NEURONTIN) 600 MG tablet Take 600 mg by mouth 2 (two) times daily.  0 01/29/2015 at Unknown time  . ipratropium-albuterol (DUONEB) 0.5-2.5 (3) MG/3ML SOLN Inhale 3 mLs into  the lungs every 6 (six) hours as needed. For wheezing   01/29/2015 at Unknown time  . lansoprazole (PREVACID) 30 MG capsule Take 30 mg by mouth daily.   01/29/2015 at Unknown time  . levETIRAcetam (KEPPRA) 500 MG tablet Take 500 mg by mouth 2 (two) times daily.  0 01/29/2015 at Unknown time  . oxyCODONE (OXY IR/ROXICODONE) 5 MG immediate release tablet Take 5 mg by mouth 2 (two) times daily.  0 01/29/2015 at Unknown time  . promethazine (PHENERGAN) 25 MG tablet Take 25 mg by mouth 3 (three) times daily as needed. For nausea and vomiting.  0 01/29/2015 at Unknown time  . SEROQUEL XR 150 MG 24 hr tablet Take 150 mg by mouth at bedtime.  0 01/29/2015 at Unknown time  . spironolactone (ALDACTONE) 50 MG tablet Take 50 mg by mouth 2 (two) times daily.   01/29/2015 at Unknown time    Current medications: Current Facility-Administered Medications  Medication Dose Route Frequency Provider Last Rate Last Dose  . 0.9 %  sodium chloride infusion   Intravenous Continuous Auburn Bilberry, MD 100 mL/hr at 01/31/15 0425    . albuterol (PROVENTIL) (2.5 MG/3ML) 0.083% nebulizer solution 2.5 mg  2.5 mg Nebulization Q6H PRN Barbette Reichmann, MD      .  feeding supplement (RESOURCE BREEZE) (RESOURCE BREEZE) liquid 1 Container  1 Container Oral TID WC Barbette Reichmann, MD   1 Container at 01/31/15 1200  . folic acid (FOLVITE) tablet 1 mg  1 mg Oral Daily Clydie Braun, MD   1 mg at 01/31/15 0934  . ipratropium-albuterol (DUONEB) 0.5-2.5 (3) MG/3ML nebulizer solution 3 mL  3 mL Inhalation Q6H PRN Auburn Bilberry, MD      . lactulose (CHRONULAC) 10 GM/15ML solution 30 g  30 g Oral TID Gayla Doss, MD      . levETIRAcetam (KEPPRA) tablet 500 mg  500 mg Oral BID Auburn Bilberry, MD   500 mg at 01/31/15 1019  . LORazepam (ATIVAN) injection 2-3 mg  2-3 mg Intravenous Q1H PRN Barbette Reichmann, MD   2 mg at 01/31/15 1420  . morphine 2 MG/ML injection 1 mg  1 mg Intravenous Q4H PRN Auburn Bilberry, MD      . multivitamin with minerals  tablet 1 tablet  1 tablet Oral Daily Clydie Braun, MD   1 tablet at 01/31/15 0934  . ondansetron (ZOFRAN) tablet 4 mg  4 mg Oral Q6H PRN Auburn Bilberry, MD       Or  . ondansetron (ZOFRAN) injection 4 mg  4 mg Intravenous Q6H PRN Auburn Bilberry, MD   4 mg at 01/30/15 2148  . oxyCODONE (Oxy IR/ROXICODONE) immediate release tablet 5 mg  5 mg Oral Q6H PRN Clydie Braun, MD   5 mg at 01/31/15 0425  . pantoprazole (PROTONIX) injection 40 mg  40 mg Intravenous Q12H Auburn Bilberry, MD   40 mg at 01/31/15 0934  . piperacillin-tazobactam (ZOSYN) IVPB 3.375 g  3.375 g Intravenous 3 times per day Auburn Bilberry, MD   3.375 g at 01/31/15 1420  . predniSONE (DELTASONE) tablet 40 mg  40 mg Oral Q breakfast Auburn Bilberry, MD   40 mg at 01/31/15 0934  . QUEtiapine (SEROQUEL XR) 24 hr tablet 150 mg  150 mg Oral QHS Auburn Bilberry, MD   150 mg at 01/30/15 2119  . sodium chloride 0.9 % injection 3 mL  3 mL Intravenous Q12H Auburn Bilberry, MD   3 mL at 01/31/15 1100  . thiamine (VITAMIN B-1) tablet 100 mg  100 mg Oral Daily Clydie Braun, MD   100 mg at 01/31/15 1610   Or  . thiamine (B-1) injection 100 mg  100 mg Intravenous Daily Clydie Braun, MD      . Melene Muller ON 02/01/2015] vancomycin (VANCOCIN) IVPB 1000 mg/200 mL premix  1,000 mg Intravenous Q36H Auburn Bilberry, MD          Allergies: Allergies  Allergen Reactions  . Oxycontin [Oxycodone Hcl] Itching  . Codeine Itching    Other reaction(s): Unknown      Past Medical History: Past Medical History  Diagnosis Date  . Cirrhosis   . Brain aneurysm   . Pancreatitis   . Bipolar 1 disorder   . ADHD (attention deficit hyperactivity disorder)   . Anxiety   . Seizures   . Suicide attempt   . COPD (chronic obstructive pulmonary disease)   . GERD (gastroesophageal reflux disease)      Past Surgical History: Past Surgical History  Procedure Laterality Date  . Brain surgery    . Laproscopy       Family History: Family History   Problem Relation Age of Onset  . Hypertension       Social History: History   Social History  . Marital Status: Married  Spouse Name: N/A  . Number of Children: N/A  . Years of Education: N/A   Occupational History  . Not on file.   Social History Main Topics  . Smoking status: Current Every Day Smoker -- 0.50 packs/day  . Smokeless tobacco: Never Used  . Alcohol Use: 18.0 oz/week    30 Standard drinks or equivalent per week     Comment: daily  . Drug Use: No  . Sexual Activity: Not on file   Other Topics Concern  . Not on file   Social History Narrative  . No narrative on file     Review of Systems: Review of Systems  Unable to perform ROS: mental status change     Vital Signs: Blood pressure 109/96, pulse 111, temperature 98.4 F (36.9 C), temperature source Oral, resp. rate 15, height  (1.6 m), weight 67.132 kg (148 lb), SpO2 92 %.  Weight trends: Filed Weights   01/30/15 1128  Weight: 67.132 kg (148 lb)    Physical Exam: General: obtunded  Head: Normocephalic, atraumatic.  Eyes: Marked icterus, PERRL  Nose: Mucous membranes moist, not inflammed, nonerythematous.  Throat: Patient would not open mouth to allow exam   Neck: Supple, trachea midline  Lungs:  Normal respiratory effort. Clear to auscultation BL without crackles or wheezes.  Heart: RRR. S1 and S2 normal without gallop, murmur, or rubs.  Abdomen:  Distended, soft, no obvious tenderness, no rebound  Extremities: Trace b/l LE edema  Neurologic: Obtunded, not following commands  Skin: Jaundiced skin    Lab results: Basic Metabolic Panel:  Recent Labs Lab 01/30/15 1155 01/31/15 0406  NA 128* 129*  K 4.8 5.5*  CL 93* 99*  CO2 23 21*  GLUCOSE 133* 115*  BUN 24* 26*  CREATININE 2.98* NOT VALID  CALCIUM 8.4* 7.9*    Liver Function Tests:  Recent Labs Lab 01/30/15 1155 01/31/15 0406  AST 395* 287*  ALT 202* 171*  ALKPHOS 175* 149*  BILITOT 26.6* 24.0*  PROT 6.7  6.4*  ALBUMIN 2.3* 2.1*    Recent Labs Lab 01/30/15 1155  LIPASE 34    Recent Labs Lab 01/30/15 1240  AMMONIA 109*    CBC:  Recent Labs Lab 01/30/15 1155 01/31/15 0406  WBC 23.3* 21.7*  NEUTROABS 17.1*  --   HGB 11.3* 11.0*  HCT 33.0* 32.3*  MCV 101.5* 101.0*  PLT 180 183    Cardiac Enzymes:  Recent Labs Lab 01/30/15 1155 01/30/15 1728 01/31/15 0406  TROPONINI 0.07* 0.07* <0.03    BNP: Invalid input(s): POCBNP  CBG: No results for input(s): GLUCAP in the last 168 hours.  Microbiology: Results for orders placed or performed during the hospital encounter of 01/30/15  Urine culture     Status: None (Preliminary result)   Collection Time: 01/30/15 12:41 PM  Result Value Ref Range Status   Specimen Description URINE, CATHETERIZED  Final   Special Requests Normal  Final   Culture NO GROWTH <24 HRS  Final   Report Status PENDING  Incomplete  Blood culture (routine x 2)     Status: None (Preliminary result)   Collection Time: 01/30/15  1:18 PM  Result Value Ref Range Status   Specimen Description RIGHT ANTECUBITAL  Final   Special Requests   Final    BOTTLES DRAWN AEROBIC AND ANAEROBIC  5 CC AEROBIC, .5 CC ANAEROBIC   Culture NO GROWTH < 24 HOURS  Final   Report Status PENDING  Incomplete  Blood culture (routine x 2)  Status: None (Preliminary result)   Collection Time: 01/30/15  1:20 PM  Result Value Ref Range Status   Specimen Description 4 BLOOD RIGHT  Final   Special Requests   Final    BOTTLES DRAWN AEROBIC AND ANAEROBIC  .5 CC AEROBIC, 2.5 CC ANAEROBIC   Culture NO GROWTH < 24 HOURS  Final   Report Status PENDING  Incomplete  MRSA PCR Screening     Status: None   Collection Time: 01/31/15 12:48 PM  Result Value Ref Range Status   MRSA by PCR NEGATIVE NEGATIVE Final    Comment:        The GeneXpert MRSA Assay (FDA approved for NASAL specimens only), is one component of a comprehensive MRSA colonization surveillance program. It is  not intended to diagnose MRSA infection nor to guide or monitor treatment for MRSA infections.     Coagulation Studies:  Recent Labs  01/30/15 1600 01/31/15 0406  LABPROT 26.2* 26.0*  INR 2.39 2.37    Urinalysis:  Recent Labs  01/30/15 1241  COLORURINE AMBER*  LABSPEC 1.019  PHURINE 5.0  GLUCOSEU 50*  HGBUR 1+*  BILIRUBINUR 2+*  KETONESUR NEGATIVE  PROTEINUR 30*  NITRITE NEGATIVE  LEUKOCYTESUR NEGATIVE      Imaging: Ct Head Wo Contrast  01/30/2015   CLINICAL DATA:  Loss of motor skills over the last cerebral days. Fevers. Mental status changes. History of aneurysm.  EXAM: CT HEAD WITHOUT CONTRAST  TECHNIQUE: Contiguous axial images were obtained from the base of the skull through the vertex without intravenous contrast.  COMPARISON:  10/27/2014 and multiple previous  FINDINGS: Previous right-sided pterional craniotomy. No aneurysm clip is visible by CT. The brain shows mild volume loss without evidence of old or acute focal infarction, mass lesion, hemorrhage, hydrocephalus or extra-axial collection. No acute calvarial finding. Sinuses, middle ears and mastoids are clear.  IMPRESSION: No acute or reversible finding. Previous pterional craniotomy on the right. Mild brain atrophy without focal finding.   Electronically Signed   By: Paulina Fusi M.D.   On: 01/30/2015 13:00   US Abdomen Complete  01/30/2015   CLINICAL DATA:  Abdominal pain. Evaluate biliary tract and evaluate for ascites. Cirrhosis. Pancreatitis.  EXAM: ULTRASOUND ABDOMEN COMPLETE  COMPARISON:  CT of 08/30/2014  FINDINGS: Gallbladder: Minimal gallbladder sludge. Gallbladder wall thickening, 8 mm. Pericholecystic fluid. Sonographic Murphy's sign was not elicited.  Common bile duct: Diameter: Normal, 5 mm.  Liver: Moderate cirrhosis.  No focal liver lesion.  IVC: No abnormality visualized.  Pancreas: Not visualized due to overlying bowel gas.  Spleen: Splenomegaly, with the splenic volume of 480 cc.  Right Kidney:  Length: 10.0 cm. Echogenicity within normal limits. No mass or hydronephrosis visualized.  Left Kidney: Length: 10.7 cm. Echogenicity within normal limits. No mass or hydronephrosis visualized.  Abdominal aorta: No aneurysm visualized.  Other findings: Small volume ascites, primarily identified adjacent the liver.  Heterogeneous flow within the portal vein, partially hepatofugal.  Mild degradation secondary to overlying bowel gas.  IMPRESSION: 1. Cirrhosis with portal venous hypertension, splenomegaly, and heterogeneous portal venous flow. 2. Gallbladder sludge with nonspecific gallbladder wall thickening (in the setting of portal venous hypertension) and pericholecystic fluid. No sonographic Murphy sign to confirm acute cholecystitis. 3. Small volume abdominal ascites. 4. Mild limitations secondary to overlying bowel gas.   Electronically Signed   By: Jeronimo Greaves M.D.   On: 01/30/2015 15:07   Dg Chest Portable 1 View  01/30/2015   CLINICAL DATA:  Generalize weakness. History of brain  aneurysm. Smoker. Cirrhosis.  EXAM: PORTABLE CHEST - 1 VIEW  COMPARISON:  10/27/2014  FINDINGS: Midline trachea. Normal heart size. Mild right hemidiaphragm elevation. No pleural effusion or pneumothorax. New patchy right worse than left lower lung airspace disease.  IMPRESSION: New bibasilar opacities, favored to represent atelectasis. Early pneumonia felt less likely. Consider radiographic followup at 1-2 days.   Electronically Signed   By: Jeronimo GreavesKyle  Talbot M.D.   On: 01/30/2015 13:05      Assessment & Plan: Pt is a 44 y.o. yo female with a PMHx of cirrhosis of the liver, cerebral aneurysm, pancreatitis, bipolar disorder, seizure disorder, COPD, GERD, history of suicide attempt, who was [facility]30[facility]2015 for evaluation of abdominal pain.   1.  Acute renal failure unspecified. 2.  Hyponatremia. 3.  Hyperkalemia. 4.  Anemia unspecified D64.9 5.  Hypotension. 6.  Hx of cirrhosis, ? Acute alcoholic  hepatitis  Plan:  The patient appears to be quite ill at this point in time. We have been consult and for evaluation management of acute renal failure. Hepatorenal syndrome is certainly a consideration given the fact that urine sodium is less than 10. At this point in time we recommend continued supportive care with hydration with 0.9 normal saline. We would also recommend a map of 65 or greater and the patient is to be started on pressor therapy. We will also start the patient on albumin 25 g IV every 8 hours for now.  Renal ultrasound was performed and was negative for hydronephrosis. No urgent indication for dialysis however we may need to consider this if renal function continues to worsen and electrolytes continue to worsen such as potassium. Overall prognosis appears to be quite guarded. Thanks for consult.

## 2015-01-31 NOTE — Consult Note (Signed)
Subjective: Patient seen for abnormal liver tests and hepatic encephalopathy. Patient is positive at times to voice but quite lethargic. Transfer to ICU noted for hypotension. Early her blood pressure is better. There has not been any start of pressor agent.  Objective: Vital signs in last 24 hours: Temp:  [97.9 F (36.6 C)-98.9 F (37.2 C)] 98.4 F (36.9 C) (07/05 1243) Pulse Rate:  [109-119] 111 (07/05 1243) Resp:  [15-18] 15 (07/05 1243) BP: (78-146)/(46-99) 109/96 mmHg (07/05 1243) SpO2:  [92 %-99 %] 92 % (07/05 1243) Blood pressure 109/96, pulse 111, temperature 98.4 F (36.9 C), temperature source Oral, resp. rate 15, height 5\' 3"  (1.6 m), weight 67.132 kg (148 lb), SpO2 92 %.   Intake/Output from previous day: 07/04 0701 - 07/05 0700 In: 948 [I.V.:948] Out: 450 [Urine:450]  Intake/Output this shift:     General appearance:  Deeply jaundiced lethargic Resp:  Clear to auscultation Cardio:  Tachycardia without rub or gallop GI:  Distended positive ascites sounds are positive. He is minimally tender to palpation. Is no rebound. Extremities:  No clubbing cyanosis or edema   Lab Results: Results for orders placed or performed during the hospital encounter of 01/30/15 (from the past 24 hour(s))  APTT     Status: Abnormal   Collection Time: 01/30/15  3:42 PM  Result Value Ref Range   aPTT 52 (H) 24 - 36 seconds  Protime-INR     Status: Abnormal   Collection Time: 01/30/15  4:00 PM  Result Value Ref Range   Prothrombin Time 26.2 (H) 11.4 - 15.0 seconds   INR 2.39   Troponin I     Status: Abnormal   Collection Time: 01/30/15  5:28 PM  Result Value Ref Range   Troponin I 0.07 (H) <0.031 ng/mL  CBC     Status: Abnormal   Collection Time: 01/31/15  4:06 AM  Result Value Ref Range   WBC 21.7 (H) 3.6 - 11.0 K/uL   RBC 3.20 (L) 3.80 - 5.20 MIL/uL   Hemoglobin 11.0 (L) 12.0 - 16.0 g/dL   HCT 16.132.3 (L) 09.635.0 - 04.547.0 %   MCV 101.0 (H) 80.0 - 100.0 fL   MCH 34.3 (H) 26.0 - 34.0  pg   MCHC 34.0 32.0 - 36.0 g/dL   RDW 40.915.8 (H) 81.111.5 - 91.414.5 %   Platelets 183 150 - 440 K/uL  Comprehensive metabolic panel     Status: Abnormal   Collection Time: 01/31/15  4:06 AM  Result Value Ref Range   Sodium 129 (L) 135 - 145 mmol/L   Potassium 5.5 (H) 3.5 - 5.1 mmol/L   Chloride 99 (L) 101 - 111 mmol/L   CO2 21 (L) 22 - 32 mmol/L   Glucose, Bld 115 (H) 65 - 99 mg/dL   BUN 26 (H) 6 - 20 mg/dL   Creatinine, Ser NOT VALID 0.44 - 1.00 mg/dL   Calcium 7.9 (L) 8.9 - 10.3 mg/dL   Total Protein 6.4 (L) 6.5 - 8.1 g/dL   Albumin 2.1 (L) 3.5 - 5.0 g/dL   AST 782287 (H) 15 - 41 U/L   ALT 171 (H) 14 - 54 U/L   Alkaline Phosphatase 149 (H) 38 - 126 U/L   Total Bilirubin 24.0 (HH) 0.3 - 1.2 mg/dL   GFR calc non Af Amer NOT CALCULATED >60 mL/min   GFR calc Af Amer NOT CALCULATED >60 mL/min   Anion gap 9 5 - 15  Protime-INR     Status: Abnormal   Collection  Time: 01/31/15  4:06 AM  Result Value Ref Range   Prothrombin Time 26.0 (H) 11.4 - 15.0 seconds   INR 2.37   Troponin I     Status: None   Collection Time: 01/31/15  4:06 AM  Result Value Ref Range   Troponin I <0.03 <0.031 ng/mL      Recent Labs  01/30/15 1155 01/31/15 0406  WBC 23.3* 21.7*  HGB 11.3* 11.0*  HCT 33.0* 32.3*  PLT 180 183   BMET  Recent Labs  01/30/15 1155 01/31/15 0406  NA 128* 129*  K 4.8 5.5*  CL 93* 99*  CO2 23 21*  GLUCOSE 133* 115*  BUN 24* 26*  CREATININE 2.98* NOT VALID  CALCIUM 8.4* 7.9*   LFT  Recent Labs  01/30/15 1155 01/31/15 0406  PROT 6.7 6.4*  ALBUMIN 2.3* 2.1*  AST 395* 287*  ALT 202* 171*  ALKPHOS 175* 149*  BILITOT 26.6* 24.0*  BILIDIR 16.4*  --    PT/INR  Recent Labs  01/30/15 1600 01/31/15 0406  LABPROT 26.2* 26.0*  INR 2.39 2.37   Hepatitis Panel No results for input(s): HEPBSAG, HCVAB, HEPAIGM, HEPBIGM in the last 72 hours. C-Diff No results for input(s): CDIFFTOX in the last 72 hours. No results for input(s): CDIFFPCR in the last 72  hours.   Studies/Results: Ct Head Wo Contrast  01/30/2015   CLINICAL DATA:  Loss of motor skills over the last cerebral days. Fevers. Mental status changes. History of aneurysm.  EXAM: CT HEAD WITHOUT CONTRAST  TECHNIQUE: Contiguous axial images were obtained from the base of the skull through the vertex without intravenous contrast.  COMPARISON:  10/27/2014 and multiple previous  FINDINGS: Previous right-sided pterional craniotomy. No aneurysm clip is visible by CT. The brain shows mild volume loss without evidence of old or acute focal infarction, mass lesion, hemorrhage, hydrocephalus or extra-axial collection. No acute calvarial finding. Sinuses, middle ears and mastoids are clear.  IMPRESSION: No acute or reversible finding. Previous pterional craniotomy on the right. Mild brain atrophy without focal finding.   Electronically Signed   By: Paulina Fusi M.D.   On: 01/30/2015 13:00   US Abdomen Complete  01/30/2015   CLINICAL DATA:  Abdominal pain. Evaluate biliary tract and evaluate for ascites. Cirrhosis. Pancreatitis.  EXAM: ULTRASOUND ABDOMEN COMPLETE  COMPARISON:  CT of 08/30/2014  FINDINGS: Gallbladder: Minimal gallbladder sludge. Gallbladder wall thickening, 8 mm. Pericholecystic fluid. Sonographic Murphy's sign was not elicited.  Common bile duct: Diameter: Normal, 5 mm.  Liver: Moderate cirrhosis.  No focal liver lesion.  IVC: No abnormality visualized.  Pancreas: Not visualized due to overlying bowel gas.  Spleen: Splenomegaly, with the splenic volume of 480 cc.  Right Kidney: Length: 10.0 cm. Echogenicity within normal limits. No mass or hydronephrosis visualized.  Left Kidney: Length: 10.7 cm. Echogenicity within normal limits. No mass or hydronephrosis visualized.  Abdominal aorta: No aneurysm visualized.  Other findings: Small volume ascites, primarily identified adjacent the liver.  Heterogeneous flow within the portal vein, partially hepatofugal.  Mild degradation secondary to overlying bowel  gas.  IMPRESSION: 1. Cirrhosis with portal venous hypertension, splenomegaly, and heterogeneous portal venous flow. 2. Gallbladder sludge with nonspecific gallbladder wall thickening (in the setting of portal venous hypertension) and pericholecystic fluid. No sonographic Murphy sign to confirm acute cholecystitis. 3. Small volume abdominal ascites. 4. Mild limitations secondary to overlying bowel gas.   Electronically Signed   By: Jeronimo Greaves M.D.   On: 01/30/2015 15:07   Dg Chest Portable  1 View  01/30/2015   CLINICAL DATA:  Generalize weakness. History of brain aneurysm. Smoker. Cirrhosis.  EXAM: PORTABLE CHEST - 1 VIEW  COMPARISON:  10/27/2014  FINDINGS: Midline trachea. Normal heart size. Mild right hemidiaphragm elevation. No pleural effusion or pneumothorax. New patchy right worse than left lower lung airspace disease.  IMPRESSION: New bibasilar opacities, favored to represent atelectasis. Early pneumonia felt less likely. Consider radiographic followup at 1-2 days.   Electronically Signed   By: Jeronimo Greaves M.D.   On: 01/30/2015 13:05    Scheduled Inpatient Medications:   . feeding supplement (RESOURCE BREEZE)  1 Container Oral TID WC  . folic acid  1 mg Oral Daily  . lactulose  30 g Oral TID  . levETIRAcetam  500 mg Oral BID  . LORazepam  0-4 mg Intravenous Q6H   Followed by  . [START ON 02/01/2015] LORazepam  0-4 mg Intravenous Q12H  . multivitamin with minerals  1 tablet Oral Daily  . pantoprazole (PROTONIX) IV  40 mg Intravenous Q12H  . piperacillin-tazobactam  3.375 g Intravenous 3 times per day  . predniSONE  40 mg Oral Q breakfast  . QUEtiapine Fumarate  150 mg Oral QHS  . sodium chloride  3 mL Intravenous Q12H  . thiamine  100 mg Oral Daily   Or  . thiamine  100 mg Intravenous Daily  . [START ON 02/01/2015] vancomycin  1,000 mg Intravenous Q36H    Continuous Inpatient Infusions:   . sodium chloride 100 mL/hr at 01/31/15 0425    PRN Inpatient Medications:  albuterol,  ipratropium-albuterol, LORazepam **OR** LORazepam, morphine injection, ondansetron **OR** ondansetron (ZOFRAN) IV, oxyCODONE  Miscellaneous:   Assessment:  1. Subacute liver failure/hepatic encephalopathy area to alcohol abuse. Concern that she could have hepatorenal syndrome. Random urine sodium less than 10. Liver labs have minimally improved overnight. Patient currently on anti-biotics as well as steroids for hepatic encephalopathy 2. Ongoing alcohol abuse. Her outpatient notes patient has declined treatment.  Plan:  1. Continue current. Daily LFTs and pro time. Will request follow follow nephrology consult in regards to possible hepatorenal syndrome Case discussed with Dr. Marcello Fennel.  Christena Deem MD 01/31/2015, 1:35 PM

## 2015-01-31 NOTE — Progress Notes (Signed)
PROGRESS NOTE  Crystal [name] ZOX:[id number] DOB: [birth year] DOA: 01/30/2015 PCP: Barbette ReichmannHANDE,Asyah Candler, MD  Subjective:  44 y/o F with hx of Alcohol abuse, Bipolar disorder, Seizures from alcohol withdrawal,Anxiety, ADHD admitted with abdominal pain and altered mental status with increased lethargy consistent with Hepatic encephalopathy. Reportedly had quit drinking approx 4 days back  This am drowsy and hypotensive. C/o Upper abdominal pain  Consultants: GI- Dr. Ricki RodriguezSkulski   Objective: BP 103/67 mmHg  Pulse 112  Temp(Src) 98.5 F (36.9 C) (Oral)  Resp 18  Ht 5\' 3"  (1.6 m)  Wt 67.132 kg (148 lb)  BMI 26.22 kg/m2  SpO2 94%  Intake/Output Summary (Last 24 hours) at 01/31/15 1140 Last data filed at 01/31/15 0745  Gross per 24 hour  Intake    948 ml  Output    450 ml  Net    498 ml   Filed Weights   01/30/15 1128  Weight: 67.132 kg (148 lb)    Exam:   General: Jaundiced  Spider nevi and peteciae noted   Cardiovascular: S1 S2   Respiratory: Clear to auscultation  Abdomen: distenede  Neuro:Speech is slurred  Data Reviewed: Basic Metabolic Panel:  Recent Labs Lab 01/30/15 1155 01/31/15 0406  NA 128* 129*  K 4.8 5.5*  CL 93* 99*  CO2 23 21*  GLUCOSE 133* 115*  BUN 24* 26*  CREATININE 2.98* NOT VALID  CALCIUM 8.4* 7.9*   Liver Function Tests:  Recent Labs Lab 01/30/15 1155 01/31/15 0406  AST 395* 287*  ALT 202* 171*  ALKPHOS 175* 149*  BILITOT 26.6* 24.0*  PROT 6.7 6.4*  ALBUMIN 2.3* 2.1*    Recent Labs Lab 01/30/15 1155  LIPASE 34    Recent Labs Lab 01/30/15 1240  AMMONIA 109*   CBC:  Recent Labs Lab 01/30/15 1155 01/31/15 0406  WBC 23.3* 21.7*  NEUTROABS 17.1*  --   HGB 11.3* 11.0*  HCT 33.0* 32.3*  MCV 101.5* 101.0*  PLT 180 183   Cardiac Enzymes:    Recent Labs Lab 01/30/15 1155 01/30/15 1728 01/31/15 0406  TROPONINI 0.07* 0.07* <0.03   BNP (last 3 results) No results for input(s): BNP in the last 8760  hours.  ProBNP (last 3 results) No results for input(s): PROBNP in the last 8760 hours.  CBG: No results for input(s): GLUCAP in the last 168 hours.  Recent Results (from the past 240 hour(s))  Urine culture     Status: None (Preliminary result)   Collection Time: 01/30/15 12:41 PM  Result Value Ref Range Status   Specimen Description URINE, CATHETERIZED  Final   Special Requests Normal  Final   Culture NO GROWTH <24 HRS  Final   Report Status PENDING  Incomplete  Blood culture (routine x 2)     Status: None (Preliminary result)   Collection Time: 01/30/15  1:18 PM  Result Value Ref Range Status   Specimen Description RIGHT ANTECUBITAL  Final   Special Requests   Final    BOTTLES DRAWN AEROBIC AND ANAEROBIC  5 CC AEROBIC, .5 CC ANAEROBIC   Culture NO GROWTH < 24 HOURS  Final   Report Status PENDING  Incomplete  Blood culture (routine x 2)     Status: None (Preliminary result)   Collection Time: 01/30/15  1:20 PM  Result Value Ref Range Status   Specimen Description 4 BLOOD RIGHT  Final   Special Requests   Final    BOTTLES DRAWN AEROBIC AND ANAEROBIC  .5 CC AEROBIC, 2.5 CC  ANAEROBIC   Culture NO GROWTH < 24 HOURS  Final   Report Status PENDING  Incomplete     Studies: Ct Head Wo Contrast  01/30/2015   CLINICAL DATA:  Loss of motor skills over the last cerebral days. Fevers. Mental status changes. History of aneurysm.  EXAM: CT HEAD WITHOUT CONTRAST  TECHNIQUE: Contiguous axial images were obtained from the base of the skull through the vertex without intravenous contrast.  COMPARISON:  10/27/2014 and multiple previous  FINDINGS: Previous right-sided pterional craniotomy. No aneurysm clip is visible by CT. The brain shows mild volume loss without evidence of old or acute focal infarction, mass lesion, hemorrhage, hydrocephalus or extra-axial collection. No acute calvarial finding. Sinuses, middle ears and mastoids are clear.  IMPRESSION: No acute or reversible finding. Previous  pterional craniotomy on the right. Mild brain atrophy without focal finding.   Electronically Signed   By: Paulina Fusi M.D.   On: 01/30/2015 13:00   US Abdomen Complete  01/30/2015   CLINICAL DATA:  Abdominal pain. Evaluate biliary tract and evaluate for ascites. Cirrhosis. Pancreatitis.  EXAM: ULTRASOUND ABDOMEN COMPLETE  COMPARISON:  CT of 08/30/2014  FINDINGS: Gallbladder: Minimal gallbladder sludge. Gallbladder wall thickening, 8 mm. Pericholecystic fluid. Sonographic Murphy's sign was not elicited.  Common bile duct: Diameter: Normal, 5 mm.  Liver: Moderate cirrhosis.  No focal liver lesion.  IVC: No abnormality visualized.  Pancreas: Not visualized due to overlying bowel gas.  Spleen: Splenomegaly, with the splenic volume of 480 cc.  Right Kidney: Length: 10.0 cm. Echogenicity within normal limits. No mass or hydronephrosis visualized.  Left Kidney: Length: 10.7 cm. Echogenicity within normal limits. No mass or hydronephrosis visualized.  Abdominal aorta: No aneurysm visualized.  Other findings: Small volume ascites, primarily identified adjacent the liver.  Heterogeneous flow within the portal vein, partially hepatofugal.  Mild degradation secondary to overlying bowel gas.  IMPRESSION: 1. Cirrhosis with portal venous hypertension, splenomegaly, and heterogeneous portal venous flow. 2. Gallbladder sludge with nonspecific gallbladder wall thickening (in the setting of portal venous hypertension) and pericholecystic fluid. No sonographic Murphy sign to confirm acute cholecystitis. 3. Small volume abdominal ascites. 4. Mild limitations secondary to overlying bowel gas.   Electronically Signed   By: Jeronimo Greaves M.D.   On: 01/30/2015 15:07   Dg Chest Portable 1 View  01/30/2015   CLINICAL DATA:  Generalize weakness. History of brain aneurysm. Smoker. Cirrhosis.  EXAM: PORTABLE CHEST - 1 VIEW  COMPARISON:  10/27/2014  FINDINGS: Midline trachea. Normal heart size. Mild right hemidiaphragm elevation. No pleural  effusion or pneumothorax. New patchy right worse than left lower lung airspace disease.  IMPRESSION: New bibasilar opacities, favored to represent atelectasis. Early pneumonia felt less likely. Consider radiographic followup at 1-2 days.   Electronically Signed   By: Jeronimo Greaves M.D.   On: 01/30/2015 13:05    Scheduled Meds: . feeding supplement (RESOURCE BREEZE)  1 Container Oral TID WC  . folic acid  1 mg Oral Daily  . lactulose  30 g Oral TID  . levETIRAcetam  500 mg Oral BID  . LORazepam  0-4 mg Intravenous Q6H   Followed by  . [START ON 02/01/2015] LORazepam  0-4 mg Intravenous Q12H  . multivitamin with minerals  1 tablet Oral Daily  . pantoprazole (PROTONIX) IV  40 mg Intravenous Q12H  . piperacillin-tazobactam  3.375 g Intravenous 3 times per day  . predniSONE  40 mg Oral Q breakfast  . QUEtiapine Fumarate  150 mg Oral QHS  .  sodium chloride  3 mL Intravenous Q12H  . thiamine  100 mg Oral Daily   Or  . thiamine  100 mg Intravenous Daily  . [START ON 02/01/2015] vancomycin  1,000 mg Intravenous Q36H    Continuous Infusions: . sodium chloride 100 mL/hr at 01/31/15 0425    Assessment/Plan:  1 Altered mental status secondary to acute Hepatic encephalopathy and Cirrhosis of liver  Seen by GI -On lactulose and Prednisone  2 Leukocytosis: Possible sepsis - On Zosyn and Vanc  3 Elevated Troponin secondary to Demand ischemia  4 Seizure disorder: On Keppra  5 Acute Renal failure? Hepatorenal syndrome Received Fluid  Bolus  Pt continues to be  Hypotensive  Transfer pt to CCU    Code Status: Full  Family Communication: neice     Naleyah Ohlinger   01/31/2015, 11:40 AM  LOS: 1 day
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
## 2015-02-01 ENCOUNTER — Encounter: Payer: Self-pay | Admitting: Psychiatry

## 2015-02-01 DIAGNOSIS — F10231 Alcohol dependence with withdrawal delirium: Secondary | ICD-10-CM

## 2015-02-01 DIAGNOSIS — F1020 Alcohol dependence, uncomplicated: Secondary | ICD-10-CM | POA: Diagnosis present

## 2015-02-01 LAB — COMPREHENSIVE METABOLIC PANEL
ALBUMIN: 2.3 g/dL — AB (ref 3.5–5.0)
ALT: 135 U/L — ABNORMAL HIGH (ref 14–54)
ANION GAP: 10 (ref 5–15)
AST: 175 U/L — ABNORMAL HIGH (ref 15–41)
Alkaline Phosphatase: 132 U/L — ABNORMAL HIGH (ref 38–126)
BUN: 28 mg/dL — ABNORMAL HIGH (ref 6–20)
CALCIUM: 8.2 mg/dL — AB (ref 8.9–10.3)
CO2: 21 mmol/L — ABNORMAL LOW (ref 22–32)
Chloride: 100 mmol/L — ABNORMAL LOW (ref 101–111)
Glucose, Bld: 112 mg/dL — ABNORMAL HIGH (ref 65–99)
Potassium: 4.6 mmol/L (ref 3.5–5.1)
Sodium: 131 mmol/L — ABNORMAL LOW (ref 135–145)
Total Bilirubin: 22.4 mg/dL (ref 0.3–1.2)
Total Protein: 6 g/dL — ABNORMAL LOW (ref 6.5–8.1)

## 2015-02-01 LAB — CBC WITH DIFFERENTIAL/PLATELET
BASOS ABS: 0 10*3/uL (ref 0–0.1)
BASOS PCT: 0 %
Eosinophils Absolute: 0.2 10*3/uL (ref 0–0.7)
Eosinophils Relative: 1 %
HCT: 30.5 % — ABNORMAL LOW (ref 35.0–47.0)
HEMOGLOBIN: 10.4 g/dL — AB (ref 12.0–16.0)
LYMPHS PCT: 6 %
Lymphs Abs: 1.2 10*3/uL (ref 1.0–3.6)
MCH: 34.6 pg — ABNORMAL HIGH (ref 26.0–34.0)
MCHC: 34 g/dL (ref 32.0–36.0)
MCV: 101.8 fL — ABNORMAL HIGH (ref 80.0–100.0)
MONO ABS: 2.4 10*3/uL — AB (ref 0.2–0.9)
Monocytes Relative: 12 %
NEUTROS PCT: 81 %
Neutro Abs: 16.5 10*3/uL — ABNORMAL HIGH (ref 1.4–6.5)
Platelets: 154 10*3/uL (ref 150–440)
RBC: 3 MIL/uL — ABNORMAL LOW (ref 3.80–5.20)
RDW: 16.1 % — ABNORMAL HIGH (ref 11.5–14.5)
Smear Review: ADEQUATE
WBC: 20.3 10*3/uL — ABNORMAL HIGH (ref 3.6–11.0)

## 2015-02-01 LAB — URINE CULTURE
CULTURE: NO GROWTH
Special Requests: NORMAL

## 2015-02-01 LAB — PROTIME-INR
INR: 2.32
PROTHROMBIN TIME: 25.6 s — AB (ref 11.4–15.0)

## 2015-02-01 LAB — AMMONIA: Ammonia: 91 umol/L — ABNORMAL HIGH (ref 9–35)

## 2015-02-01 LAB — VANCOMYCIN, RANDOM: Vancomycin Rm: 7 ug/mL

## 2015-02-01 MED ORDER — ADULT MULTIVITAMIN W/MINERALS CH
1.0000 | ORAL_TABLET | Freq: Every day | ORAL | Status: DC
  Administered 2015-02-01: 1 via ORAL
  Filled 2015-02-01 (×2): qty 1

## 2015-02-01 MED ORDER — FOLIC ACID 1 MG PO TABS
1.0000 mg | ORAL_TABLET | Freq: Every day | ORAL | Status: DC
  Administered 2015-02-01: 1 mg via ORAL
  Filled 2015-02-01 (×2): qty 1

## 2015-02-01 MED ORDER — VITAMIN B-1 100 MG PO TABS
100.0000 mg | ORAL_TABLET | Freq: Every day | ORAL | Status: DC
  Administered 2015-02-01: 100 mg via ORAL
  Filled 2015-02-01 (×2): qty 1

## 2015-02-01 MED ORDER — FLEET ENEMA 7-19 GM/118ML RE ENEM
1.0000 | ENEMA | Freq: Once | RECTAL | Status: AC
  Administered 2015-02-01: 1 via RECTAL

## 2015-02-01 NOTE — Progress Notes (Signed)
Zosyn not given as of yet.  Med. Not in unit as of yet.  Passed in report as needed to still give.

## 2015-02-01 NOTE — Consult Note (Signed)
[facility] Face-to-Face Psychiatry Consult   Reason for Consult:  To evaluate patients with history of alcoholism. Referring Physician:  Dr. Marcello Fennel Patient Identification: Crystal [name] MRN:  [id number] Principal Diagnosis: <principal problem not specified> Diagnosis:   Patient Active Problem List   Diagnosis Date Noted  . Hepatic encephalopathy [K72.90] 01/30/2015    Total Time spent with patient: 1 hour  Subjective:   Crystal [name] is a 44 y.o. female patient admitted with altered mental status and liver failure due to severe alcoholism.Marland Kitchen  HPI:   Identifying data. Crystal [name] is a 44 year old female with history of severe alcoholism and bipolar disorder.  Chief complaint. The patient is unable to state.  History of present illness. Crystal [name] is not able to provide much information today but she is known to me from previous admissions. She was admitted to [facility] and then CCU for altered mental status in the course of liver and kidney failure. The patient has a long history of alcoholism but has never been interested in treatment. Last time here she was admitted to psychiatry in the fall of 2014 for similar presentation. The patient has also diagnosis of bipolar disorder for which she has been treated with Seroquel. She reports taking her medications as prescribed. For other information she directly to her husband. She denies feeling suicidal or homicidal. She confirms that she is not interested in substance abuse treatment.  Past psychiatric history the patient has had multiple admission for treatment of bipolar and substance use. She was hospitalized for the first time when a teenager she has been tried on multiple medications including Seroquel, Strattera, lithium, Depakote, Abilify, Zoloft, Cymbalta, trazodone, and Seroquel. She was hospitalized here as well as [facility]. She's been to [facility] rehabilitation facility several times as well but has never  been able to stop drinking for any period of time. There were no suicide attempts.  Family psychiatric history. Her sister overdose on pills and also had a history of substance abuse.  Social history. She was born in [place] and has ninth grade education. She dropped out of school due to multiple psychiatric admissions. She never obtained GED she is disabled from mental illness. She has a child living in [place]. He got married in 2014 in [place] and lives with her husband.  Past Medical History:  Past Medical History  Diagnosis Date  . Cirrhosis   . Brain aneurysm   . Pancreatitis   . Bipolar 1 disorder   . ADHD (attention deficit hyperactivity disorder)   . Anxiety   . Seizures   . Suicide attempt   . COPD (chronic obstructive pulmonary disease)   . GERD (gastroesophageal reflux disease)     Past Surgical History  Procedure Laterality Date  . Brain surgery    . Laproscopy     Family History:  Family History  Problem Relation Age of Onset  . Hypertension     Social History:  History  Alcohol Use  . 18.0 oz/week  . 30 Standard drinks or equivalent per week    Comment: daily     History  Drug Use No    History   Social History  . Marital Status: Married    Spouse Name: N/A  . Number of Children: N/A  . Years of Education: N/A   Social History Main Topics  . Smoking status: Current Every Day Smoker -- 0.50 packs/day  . Smokeless tobacco: Never Used  . Alcohol Use: 18.0 oz/week  30 Standard drinks or equivalent per week     Comment: daily  . Drug Use: No  . Sexual Activity: Not on file   Other Topics Concern  . None   Social History Narrative  . None   Additional Social History:                          Allergies:   Allergies  Allergen Reactions  . Oxycontin [Oxycodone Hcl] Itching  . Codeine Itching    Other reaction(s): Unknown    Labs:  Results for orders placed or performed during the hospital encounter of 01/30/15 (from the  past 48 hour(s))  Blood culture (routine x 2)     Status: None (Preliminary result)   Collection Time: 01/30/15  1:18 PM  Result Value Ref Range   Specimen Description RIGHT ANTECUBITAL    Special Requests      BOTTLES DRAWN AEROBIC AND ANAEROBIC  5 CC AEROBIC, .5 CC ANAEROBIC   Culture NO GROWTH < 24 HOURS    Report Status PENDING   Type and screen     Status: None   Collection Time: 01/30/15  1:20 PM  Result Value Ref Range   ABO/RH(D) O POS    Antibody Screen NEG    Sample Expiration 02/02/2015   Blood culture (routine x 2)     Status: None (Preliminary result)   Collection Time: 01/30/15  1:20 PM  Result Value Ref Range   Specimen Description 4 BLOOD RIGHT    Special Requests      BOTTLES DRAWN AEROBIC AND ANAEROBIC  .5 CC AEROBIC, 2.5 CC ANAEROBIC   Culture NO GROWTH < 24 HOURS    Report Status PENDING   APTT     Status: Abnormal   Collection Time: 01/30/15  3:42 PM  Result Value Ref Range   aPTT 52 (H) 24 - 36 seconds    Comment:        IF BASELINE aPTT IS ELEVATED, SUGGEST PATIENT RISK ASSESSMENT BE USED TO DETERMINE APPROPRIATE ANTICOAGULANT THERAPY.   Protime-INR     Status: Abnormal   Collection Time: 01/30/15  4:00 PM  Result Value Ref Range   Prothrombin Time 26.2 (H) 11.4 - 15.0 seconds   INR 2.39   Troponin I     Status: Abnormal   Collection Time: 01/30/15  5:28 PM  Result Value Ref Range   Troponin I 0.07 (H) <0.031 ng/mL    Comment: RESULTS PREVIOUSLY CALLED ON 01/30/15 AT 1250        PERSISTENTLY INCREASED TROPONIN VALUES IN THE RANGE OF 0.04-0.49 ng/mL CAN BE SEEN IN:       -UNSTABLE ANGINA       -CONGESTIVE HEART FAILURE       -MYOCARDITIS       -CHEST TRAUMA       -ARRYHTHMIAS       -LATE PRESENTING MYOCARDIAL INFARCTION       -COPD   CLINICAL FOLLOW-UP RECOMMENDED.   CBC     Status: Abnormal   Collection Time: 01/31/15  4:06 AM  Result Value Ref Range   WBC 21.7 (H) 3.6 - 11.0 K/uL   RBC 3.20 (L) 3.80 - 5.20 MIL/uL   Hemoglobin 11.0 (L)  12.0 - 16.0 g/dL   HCT 40.9 (L) 81.1 - 91.4 %   MCV 101.0 (H) 80.0 - 100.0 fL   MCH 34.3 (H) 26.0 - 34.0 pg   MCHC 34.0 32.0 - 36.0  g/dL   RDW 16.1 (H) 09.6 - 04.5 %   Platelets 183 150 - 440 K/uL    Comment: PLATELET CLUMPS NOTED ON SMEAR, COUNT APPEARS ADEQUATE  Comprehensive metabolic panel     Status: Abnormal   Collection Time: 01/31/15  4:06 AM  Result Value Ref Range   Sodium 129 (L) 135 - 145 mmol/L   Potassium 5.5 (H) 3.5 - 5.1 mmol/L   Chloride 99 (L) 101 - 111 mmol/L   CO2 21 (L) 22 - 32 mmol/L   Glucose, Bld 115 (H) 65 - 99 mg/dL   BUN 26 (H) 6 - 20 mg/dL   Creatinine, Ser NOT VALID 0.44 - 1.00 mg/dL    Comment: UNABLE TO REPORT DUE TO ICTERUS   Calcium 7.9 (L) 8.9 - 10.3 mg/dL   Total Protein 6.4 (L) 6.5 - 8.1 g/dL   Albumin 2.1 (L) 3.5 - 5.0 g/dL   AST 409 (H) 15 - 41 U/L   ALT 171 (H) 14 - 54 U/L    Comment: ICTERUS AT THIS LEVEL MAY AFFECT RESULT   Alkaline Phosphatase 149 (H) 38 - 126 U/L   Total Bilirubin 24.0 (HH) 0.3 - 1.2 mg/dL    Comment: CRITICAL RESULT CALLED TO, READ BACK BY AND VERIFIED WITH Crystal [name] @ 0531 7.5.16 MPG    GFR calc non Af Amer NOT CALCULATED >60 mL/min   GFR calc Af Amer NOT CALCULATED >60 mL/min   Anion gap 9 5 - 15  Protime-INR     Status: Abnormal   Collection Time: 01/31/15  4:06 AM  Result Value Ref Range   Prothrombin Time 26.0 (H) 11.4 - 15.0 seconds   INR 2.37   Troponin I     Status: None   Collection Time: 01/31/15  4:06 AM  Result Value Ref Range   Troponin I <0.03 <0.031 ng/mL    Comment:        NO INDICATION OF MYOCARDIAL INJURY.   MRSA PCR Screening     Status: None   Collection Time: 01/31/15 12:48 PM  Result Value Ref Range   MRSA by PCR NEGATIVE NEGATIVE    Comment:        The GeneXpert MRSA Assay (FDA approved for NASAL specimens only), is one component of a comprehensive MRSA colonization surveillance program. It is not intended to diagnose MRSA infection nor to guide or monitor treatment  for MRSA infections.   Vancomycin, random     Status: None   Collection Time: 02/01/15  4:37 AM  Result Value Ref Range   Vancomycin Rm 7 ug/mL    Comment:        Random Vancomycin therapeutic range is dependent on dosage and time of specimen collection. A peak range is 20.0-40.0 ug/mL A trough range is 5.0-15.0 ug/mL          CBC with Differential/Platelet     Status: Abnormal   Collection Time: 02/01/15  4:37 AM  Result Value Ref Range   WBC 20.3 (H) 3.6 - 11.0 K/uL   RBC 3.00 (L) 3.80 - 5.20 MIL/uL   Hemoglobin 10.4 (L) 12.0 - 16.0 g/dL   HCT 81.1 (L) 91.4 - 78.2 %   MCV 101.8 (H) 80.0 - 100.0 fL   MCH 34.6 (H) 26.0 - 34.0 pg   MCHC 34.0 32.0 - 36.0 g/dL   RDW 95.6 (H) 21.3 - 08.6 %   Platelets 154 150 - 440 K/uL    Comment: RESULT REPEATED AND VERIFIED  Neutrophils Relative % 81 %   Lymphocytes Relative 6 %   Monocytes Relative 12 %   Eosinophils Relative 1 %   Basophils Relative 0 %   Neutro Abs 16.5 (H) 1.4 - 6.5 K/uL   Lymphs Abs 1.2 1.0 - 3.6 K/uL   Monocytes Absolute 2.4 (H) 0.2 - 0.9 K/uL   Eosinophils Absolute 0.2 0 - 0.7 K/uL   Basophils Absolute 0.0 0 - 0.1 K/uL   RBC Morphology MIXED RBC POPULATION    Smear Review      PLATELET CLUMPS NOTED ON SMEAR, COUNT APPEARS ADEQUATE  Protime-INR     Status: Abnormal   Collection Time: 02/01/15  4:37 AM  Result Value Ref Range   Prothrombin Time 25.6 (H) 11.4 - 15.0 seconds   INR 2.32   Comprehensive metabolic panel     Status: Abnormal   Collection Time: 02/01/15  4:37 AM  Result Value Ref Range   Sodium 131 (L) 135 - 145 mmol/L   Potassium 4.6 3.5 - 5.1 mmol/L   Chloride 100 (L) 101 - 111 mmol/L   CO2 21 (L) 22 - 32 mmol/L   Glucose, Bld 112 (H) 65 - 99 mg/dL   BUN 28 (H) 6 - 20 mg/dL   Creatinine, Ser SEE COMMENTS 0.44 - 1.00 mg/dL    Comment: UNABLE TO REPORT DUE TO ICTERUS. MPG   Calcium 8.2 (L) 8.9 - 10.3 mg/dL   Total Protein 6.0 (L) 6.5 - 8.1 g/dL   Albumin 2.3 (L) 3.5 - 5.0 g/dL   AST 161 (H)  15 - 41 U/L   ALT 135 (H) 14 - 54 U/L    Comment: ICTERUS AT THIS LEVEL MAY AFFECT RESULT   Alkaline Phosphatase 132 (H) 38 - 126 U/L   Total Bilirubin 22.4 (HH) 0.3 - 1.2 mg/dL    Comment: CRITICAL RESULT CALLED TO, READ BACK BY AND VERIFIED WITH Crystal [name] @ 0520 7.6.16 MPG    GFR calc non Af Amer NOT CALCULATED >60 mL/min   GFR calc Af Amer NOT CALCULATED >60 mL/min   Anion gap 10 5 - 15    Vitals: Blood pressure 100/65, pulse 107, temperature 97.6 F (36.4 C), temperature source Axillary, resp. rate 15, height 5\' 3"  (1.6 m), weight 67.132 kg (148 lb), SpO2 94 %.  Risk to Self: Is patient at risk for suicide?: No Risk to Others:   Prior Inpatient Therapy:   Prior Outpatient Therapy:    Current Facility-Administered Medications  Medication Dose Route Frequency Provider Last Rate Last Dose  . 0.9 %  sodium chloride infusion   Intravenous Continuous Auburn Bilberry, MD 100 mL/hr at 02/01/15 0320    . albumin human 25 % solution 25 g  25 g Intravenous Q8H Munsoor Lateef, MD   25 g at 02/01/15 0735  . albuterol (PROVENTIL) (2.5 MG/3ML) 0.083% nebulizer solution 2.5 mg  2.5 mg Nebulization Q6H PRN Vishwanath Hande, MD      . feeding supplement (RESOURCE BREEZE) (RESOURCE BREEZE) liquid 1 Container  1 Container Oral TID WC Barbette Reichmann, MD   1 Container at 02/01/15 1200  . folic acid (FOLVITE) tablet 1 mg  1 mg Oral Daily Vishwanath Hande, MD   1 mg at 02/01/15 1024  . ipratropium-albuterol (DUONEB) 0.5-2.5 (3) MG/3ML nebulizer solution 3 mL  3 mL Inhalation Q6H PRN Auburn Bilberry, MD      . lactulose (CHRONULAC) 10 GM/15ML solution 30 g  30 g Oral TID Gayla Doss, MD  30 g at 02/01/15 1024  . levETIRAcetam (KEPPRA) tablet 500 mg  500 mg Oral BID Auburn BilberryShreyang Patel, MD   500 mg at 02/01/15 1024  . LORazepam (ATIVAN) injection 2-3 mg  2-3 mg Intravenous Q1H PRN Barbette ReichmannVishwanath Hande, MD   2 mg at 02/01/15 0001  . morphine 2 MG/ML injection 1 mg  1 mg Intravenous Q4H PRN Auburn BilberryShreyang Patel, MD       . multivitamin with minerals tablet 1 tablet  1 tablet Oral Daily Barbette ReichmannVishwanath Hande, MD   1 tablet at 02/01/15 1024  . norepinephrine (LEVOPHED) 4mg  in D5W 250mL premix infusion  0-40 mcg/min Intravenous Titrated Vishwanath Hande, MD      . ondansetron (ZOFRAN) tablet 4 mg  4 mg Oral Q6H PRN Auburn BilberryShreyang Patel, MD       Or  . ondansetron (ZOFRAN) injection 4 mg  4 mg Intravenous Q6H PRN Auburn BilberryShreyang Patel, MD   4 mg at 01/30/15 2148  . oxyCODONE (Oxy IR/ROXICODONE) immediate release tablet 5 mg  5 mg Oral Q6H PRN Clydie Braunavid Fitzgerald, MD   5 mg at 01/31/15 0425  . pantoprazole (PROTONIX) injection 40 mg  40 mg Intravenous Q12H Auburn BilberryShreyang Patel, MD   40 mg at 02/01/15 1024  . piperacillin-tazobactam (ZOSYN) IVPB 3.375 g  3.375 g Intravenous 3 times per day Auburn BilberryShreyang Patel, MD   3.375 g at 01/31/15 2159  . predniSONE (DELTASONE) tablet 40 mg  40 mg Oral Q breakfast Auburn BilberryShreyang Patel, MD   40 mg at 02/01/15 0734  . QUEtiapine (SEROQUEL XR) 24 hr tablet 150 mg  150 mg Oral QHS Auburn BilberryShreyang Patel, MD   150 mg at 01/31/15 2132  . sodium chloride 0.9 % injection 3 mL  3 mL Intravenous Q12H Auburn BilberryShreyang Patel, MD   3 mL at 01/31/15 2132  . thiamine (VITAMIN B-1) tablet 100 mg  100 mg Oral Daily Vishwanath Hande, MD   100 mg at 02/01/15 1024  . vancomycin (VANCOCIN) IVPB 1000 mg/200 mL premix  1,000 mg Intravenous Q36H Auburn BilberryShreyang Patel, MD   1,000 mg at 02/01/15 [id number]    Musculoskeletal: Strength & Muscle Tone: within normal limits Gait & Station: Not tested Patient leans: N/A  Psychiatric Specialty Exam: Physical Exam  Nursing note and vitals reviewed.   Review of Systems  Musculoskeletal: Positive for myalgias.    Blood pressure 100/65, pulse 107, temperature 97.6 F (36.4 C), temperature source Axillary, resp. rate 15, height 5\' 3"  (1.6 m), weight 67.132 kg (148 lb), SpO2 94 %.Body mass index is 26.22 kg/(m^2).  General Appearance: Disheveled  Eye Contact::  Poor  Speech:  Slow and Slurred  Volume:  Decreased  Mood:   Dysphoric  Affect:  Flat  Thought Process:  Slow  Orientation:  Other:  Person and place only  Thought Content:  WDL  Suicidal Thoughts:  No  Homicidal Thoughts:  No  Memory:  Immediate;   Poor Recent;   Poor Remote;   Poor  Judgement:  Poor  Insight:  Lacking  Psychomotor Activity:  Decreased  Concentration:  Poor  Recall:  Poor  Fund of Knowledge:Poor  Language: Poor  Akathisia:  No  Handed:  Right  AIMS (if indicated):     Assets:  Social Support  ADL's:  Impaired  Cognition: Impaired,  Severe  Sleep:      Medical Decision Making: New problem, with additional work up planned, Review of Psycho-Social Stressors (1), Review or order clinical lab tests (1) and Review of Medication Regimen & Side Effects (2)  Treatment Plan Summary: Daily contact with patient to assess and evaluate symptoms and progress in treatment and Medication management  Plan:  No evidence of imminent risk to self or others at present.   Patient does not meet criteria for psychiatric inpatient admission. Disposition:   The patient is in no shape to discuss substance abuse treatment at this point. In the past as well as recently she refused substance abuse treatment on numerous occasions. Please continue supportive care and alcohol detox. Psychiatry will follow.  Jacksen Isip 02/01/2015 1:17 PM

## 2015-02-01 NOTE — Care Management Note (Signed)
Case Management Note  Patient Details  Name: Aram CandelaSonya R Agne MRN: [id number] Date of Birth: July 21, 1971  Subjective/Objective: Transferred from [facility] due to hypotension.  Admitted with abdominal pain and altered mental status with lethargy. 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 Presents from home where she lives with her spouse. She has a niece who is supportive and wants to be her POA.  CSW has consulted for abuse/neglect concerns. No insurance and no PCP.  It is noted that patient stopped drinking approximately 4 days ago although she reported to CSW that her last drink was 1 day prior to admission. ETOH intake: approximately 4-5 pints of "bootleggers" per day. CIWA protocol.  PMH: cerebral aneurysm, pancreatitis, bipolar disorder, seizure disorder, COPD, GERD, history of suicide attempt.  Hyponatremic at 131, WBC 20.3.  On IV NS, IV albumin, IV antibiotics. Nephrology, Gastroenterology, Neurology and Psychiatry consults.  Following progress.             Action/Plan:   Expected Discharge Date:                  Expected Discharge Plan:     In-House Referral:     Discharge planning Services     Post Acute Care Choice:    Choice offered to:     DME Arranged:    DME Agency:     HH Arranged:    HH Agency:     Status of Service:     Medicare Important Message Given:    Date Medicare IM Given:    Medicare IM give by:    Date Additional Medicare IM Given:    Additional Medicare Important Message give by:     If discussed at Long Length of Stay Meetings, dates discussed:    Additional Comments:  Marily MemosLisa M Adline Kirshenbaum, RN 02/01/2015, 8:57 AM

## 2015-02-01 NOTE — Consult Note (Signed)
Subjective: Patient seen for etoh hepatitis, subacute liver failure.  Patient does not respond to examiner, somnulent.  No emesis or evidence of bleeding.   Objective: Vital signs in last 24 hours: Temp:  [97.6 F (36.4 C)-99.6 F (37.6 C)] 98 F (36.7 C) (07/06 1200) Pulse Rate:  [101-119] 104 (07/06 1300) Resp:  [14-22] 14 (07/06 1300) BP: (87-112)/(47-76) 97/67 mmHg (07/06 1300) SpO2:  [88 %-98 %] 96 % (07/06 1300) Blood pressure 97/67, pulse 104, temperature 98 F (36.7 C), temperature source Axillary, resp. rate 14, height 5\' 3"  (1.6 m), weight 67.132 kg (148 lb), SpO2 96 %.   Intake/Output from previous day: 07/05 0701 - 07/06 0700 In: 3058.3 [I.V.:2458.3; IV Piggyback:600] Out: 900 [Urine:900]  Intake/Output this shift: Total I/O In: 150 [IV Piggyback:150] Out: -    General appearance:  Deeply jaundiced Resp:  cta Cardio:  rrr GI:  Mild protuberance, positive ascites. No apparent tenderness or rebound.  Extremities:     Lab Results: Results for orders placed or performed during the hospital encounter of 01/30/15 (from the past 24 hour(s))  Vancomycin, random     Status: None   Collection Time: 02/01/15  4:37 AM  Result Value Ref Range   Vancomycin Rm 7 ug/mL  CBC with Differential/Platelet     Status: Abnormal   Collection Time: 02/01/15  4:37 AM  Result Value Ref Range   WBC 20.3 (H) 3.6 - 11.0 K/uL   RBC 3.00 (L) 3.80 - 5.20 MIL/uL   Hemoglobin 10.4 (L) 12.0 - 16.0 g/dL   HCT 16.130.5 (L) 09.635.0 - 04.547.0 %   MCV 101.8 (H) 80.0 - 100.0 fL   MCH 34.6 (H) 26.0 - 34.0 pg   MCHC 34.0 32.0 - 36.0 g/dL   RDW 40.916.1 (H) 81.111.5 - 91.414.5 %   Platelets 154 150 - 440 K/uL   Neutrophils Relative % 81 %   Lymphocytes Relative 6 %   Monocytes Relative 12 %   Eosinophils Relative 1 %   Basophils Relative 0 %   Neutro Abs 16.5 (H) 1.4 - 6.5 K/uL   Lymphs Abs 1.2 1.0 - 3.6 K/uL   Monocytes Absolute 2.4 (H) 0.2 - 0.9 K/uL   Eosinophils Absolute 0.2 0 - 0.7 K/uL   Basophils  Absolute 0.0 0 - 0.1 K/uL   RBC Morphology MIXED RBC POPULATION    Smear Review      PLATELET CLUMPS NOTED ON SMEAR, COUNT APPEARS ADEQUATE  Protime-INR     Status: Abnormal   Collection Time: 02/01/15  4:37 AM  Result Value Ref Range   Prothrombin Time 25.6 (H) 11.4 - 15.0 seconds   INR 2.32   Comprehensive metabolic panel     Status: Abnormal   Collection Time: 02/01/15  4:37 AM  Result Value Ref Range   Sodium 131 (L) 135 - 145 mmol/L   Potassium 4.6 3.5 - 5.1 mmol/L   Chloride 100 (L) 101 - 111 mmol/L   CO2 21 (L) 22 - 32 mmol/L   Glucose, Bld 112 (H) 65 - 99 mg/dL   BUN 28 (H) 6 - 20 mg/dL   Creatinine, Ser SEE COMMENTS 0.44 - 1.00 mg/dL   Calcium 8.2 (L) 8.9 - 10.3 mg/dL   Total Protein 6.0 (L) 6.5 - 8.1 g/dL   Albumin 2.3 (L) 3.5 - 5.0 g/dL   AST 782175 (H) 15 - 41 U/L   ALT 135 (H) 14 - 54 U/L   Alkaline Phosphatase 132 (H) 38 - 126 U/L  Total Bilirubin 22.4 (HH) 0.3 - 1.2 mg/dL   GFR calc non Af Amer NOT CALCULATED >60 mL/min   GFR calc Af Amer NOT CALCULATED >60 mL/min   Anion gap 10 5 - 15      Recent Labs  01/30/15 1155 01/31/15 0406 02/01/15 0437  WBC 23.3* 21.7* 20.3*  HGB 11.3* 11.0* 10.4*  HCT 33.0* 32.3* 30.5*  PLT 180 183 154   BMET  Recent Labs  01/30/15 1155 01/31/15 0406 02/01/15 0437  NA 128* 129* 131*  K 4.8 5.5* 4.6  CL 93* 99* 100*  CO2 23 21* 21*  GLUCOSE 133* 115* 112*  BUN 24* 26* 28*  CREATININE 2.98* NOT VALID SEE COMMENTS  CALCIUM 8.4* 7.9* 8.2*   LFT  Recent Labs  01/30/15 1155  02/01/15 0437  PROT 6.7  < > 6.0*  ALBUMIN 2.3*  < > 2.3*  AST 395*  < > 175*  ALT 202*  < > 135*  ALKPHOS 175*  < > 132*  BILITOT 26.6*  < > 22.4*  BILIDIR 16.4*  --   --   < > = values in this interval not displayed. PT/INR  Recent Labs  01/31/15 0406 02/01/15 0437  LABPROT 26.0* 25.6*  INR 2.37 2.32   Hepatitis Panel No results for input(s): HEPBSAG, HCVAB, HEPAIGM, HEPBIGM in the last 72 hours. C-Diff No results for input(s):  CDIFFTOX in the last 72 hours. No results for input(s): CDIFFPCR in the last 72 hours.   Studies/Results: US Abdomen Complete  01/30/2015   CLINICAL DATA:  Abdominal pain. Evaluate biliary tract and evaluate for ascites. Cirrhosis. Pancreatitis.  EXAM: ULTRASOUND ABDOMEN COMPLETE  COMPARISON:  CT of 08/30/2014  FINDINGS: Gallbladder: Minimal gallbladder sludge. Gallbladder wall thickening, 8 mm. Pericholecystic fluid. Sonographic Murphy's sign was not elicited.  Common bile duct: Diameter: Normal, 5 mm.  Liver: Moderate cirrhosis.  No focal liver lesion.  IVC: No abnormality visualized.  Pancreas: Not visualized due to overlying bowel gas.  Spleen: Splenomegaly, with the splenic volume of 480 cc.  Right Kidney: Length: 10.0 cm. Echogenicity within normal limits. No mass or hydronephrosis visualized.  Left Kidney: Length: 10.7 cm. Echogenicity within normal limits. No mass or hydronephrosis visualized.  Abdominal aorta: No aneurysm visualized.  Other findings: Small volume ascites, primarily identified adjacent the liver.  Heterogeneous flow within the portal vein, partially hepatofugal.  Mild degradation secondary to overlying bowel gas.  IMPRESSION: 1. Cirrhosis with portal venous hypertension, splenomegaly, and heterogeneous portal venous flow. 2. Gallbladder sludge with nonspecific gallbladder wall thickening (in the setting of portal venous hypertension) and pericholecystic fluid. No sonographic Murphy sign to confirm acute cholecystitis. 3. Small volume abdominal ascites. 4. Mild limitations secondary to overlying bowel gas.   Electronically Signed   By: Jeronimo Greaves M.D.   On: 01/30/2015 15:07    Scheduled Inpatient Medications:   . albumin human  25 g Intravenous Q8H  . feeding supplement (RESOURCE BREEZE)  1 Container Oral TID WC  . folic acid  1 mg Oral Daily  . lactulose  30 g Oral TID  . levETIRAcetam  500 mg Oral BID  . multivitamin with minerals  1 tablet Oral Daily  . pantoprazole  (PROTONIX) IV  40 mg Intravenous Q12H  . piperacillin-tazobactam  3.375 g Intravenous 3 times per day  . predniSONE  40 mg Oral Q breakfast  . QUEtiapine Fumarate  150 mg Oral QHS  . sodium chloride  3 mL Intravenous Q12H  . thiamine  100  mg Oral Daily  . vancomycin  1,000 mg Intravenous Q36H    Continuous Inpatient Infusions:   . sodium chloride 100 mL/hr at 02/01/15 0320  . norepinephrine      PRN Inpatient Medications:  albuterol, ipratropium-albuterol, LORazepam, morphine injection, ondansetron **OR** ondansetron (ZOFRAN) IV, oxyCODONE  Miscellaneous:   Assessment:  1) subacute liver failure etoh related hepatitis. Slow improvement of lfts.  Pt stable.  Possibility of HRS-renal following with albumen/diuresis 2) history of substance/etoh abuse 3) history of bipolar-appreciate Psych input.  Plan:  1) continue current.  Will need to recheck ammonia.  If unable to do po lactulose, may need enemas.   Christena Deem MD 02/01/2015, 2:00 PM

## 2015-02-01 NOTE — Progress Notes (Signed)
Notified Dr. Marcello FennelHande about epigastric pain, reviewed pain medications and PPI, will continue to monitor and reassess.

## 2015-02-01 NOTE — Progress Notes (Signed)
Pt co epigastric pain. Paged Dr. Marcello FennelHande

## 2015-02-01 NOTE — Progress Notes (Signed)
pts husband reports giving pt about 2 enemas a week to produce a BM. Notified Hande, fleet enema ordered. Will reassess.

## 2015-02-01 NOTE — Clinical Documentation Improvement (Signed)
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 Started on IV Vancomycin and Zosyn.  Please clarify if the diagnosis of sepsis has ruled in or out and document findings in next progress note and include in discharge summary if applicable. If ruled out, please document such.  _______Other Condition__________________ _______Cannot Clinically Determine   Thank You, Shellee MiloEileen T Taye Cato ,RN Clinical Documentation Specialist:  [phone]  [facility]- [facility] Information Management

## 2015-02-01 NOTE — Progress Notes (Signed)
PROGRESS NOTE  Crystal [name] VHQ:[id number] DOB: 12/02/1970 DOA: 01/30/2015 PCP: Crystal [name]  Subjective:  44 y/o F with hx of Alcohol abuse, Bipolar disorder, Seizures from alcohol withdrawal,Anxiety, ADHD admitted with abdominal pain and altered mental status with increased lethargy consistent with Hepatic encephalopathy. Reportedly had quit drinking approx 4 days back  This am drowsy and hypotensive. C/o Upper abdominal pain  Consultants: GI- Dr. Ricki [name]   Objective: BP 110/65 mmHg  Pulse 110  Temp(Src) 97.6 F (36.4 C) (Axillary)  Resp 16  Ht 5\' 3"  (1.6 m)  Wt 67.132 kg (148 lb)  BMI 26.22 kg/m2  SpO2 94%  Intake/Output Summary (Last 24 hours) at 02/01/15 0821 Last data filed at 02/01/15 0735  Gross per 24 hour  Intake 3158.33 ml  Output    900 ml  Net 2258.33 ml   Filed Weights   01/30/15 1128  Weight: 67.132 kg (148 lb)    Exam:   General: Jaundiced  Spider nevi and peteciae noted   Cardiovascular: S1 S2   Respiratory: Clear to auscultation  Abdomen: distended/ Ascites +  Neuro:Speech is slurred  Data Reviewed: Basic Metabolic Panel:  Recent Labs Lab 01/30/15 1155 01/31/15 0406 02/01/15 0437  NA 128* 129* 131*  K 4.8 5.5* 4.6  CL 93* 99* 100*  CO2 23 21* 21*  GLUCOSE 133* 115* 112*  BUN 24* 26* 28*  CREATININE 2.98* NOT VALID SEE COMMENTS  CALCIUM 8.4* 7.9* 8.2*   Liver Function Tests:  Recent Labs Lab 01/30/15 1155 01/31/15 0406 02/01/15 0437  AST 395* 287* 175*  ALT 202* 171* 135*  ALKPHOS 175* 149* 132*  BILITOT 26.6* 24.0* 22.4*  PROT 6.7 6.4* 6.0*  ALBUMIN 2.3* 2.1* 2.3*    Recent Labs Lab 01/30/15 1155  LIPASE 34    Recent Labs Lab 01/30/15 1240  AMMONIA 109*   CBC:  Recent Labs Lab 01/30/15 1155 01/31/15 0406 02/01/15 0437  WBC 23.3* 21.7* 20.3*  NEUTROABS 17.1*  --  16.5*  HGB 11.3* 11.0* 10.4*  HCT 33.0* 32.3* 30.5*  MCV 101.5* 101.0* 101.8*  PLT 180 183 154   Cardiac Enzymes:     Recent Labs Lab 01/30/15 1155 01/30/15 1728 01/31/15 0406  TROPONINI 0.07* 0.07* <0.03   BNP (last 3 results) No results for input(s): BNP in the last 8760 hours.  ProBNP (last 3 results) No results for input(s): PROBNP in the last 8760 hours.  CBG: No results for input(s): GLUCAP in the last 168 hours.  Recent Results (from the past 240 hour(s))  Urine culture     Status: None (Preliminary result)   Collection Time: 01/30/15 12:41 PM  Result Value Ref Range Status   Specimen Description URINE, CATHETERIZED  Final   Special Requests Normal  Final   Culture NO GROWTH <24 HRS  Final   Report Status PENDING  Incomplete  Blood culture (routine x 2)     Status: None (Preliminary result)   Collection Time: 01/30/15  1:18 PM  Result Value Ref Range Status   Specimen Description RIGHT ANTECUBITAL  Final   Special Requests   Final    BOTTLES DRAWN AEROBIC AND ANAEROBIC  5 CC AEROBIC, .5 CC ANAEROBIC   Culture NO GROWTH < 24 HOURS  Final   Report Status PENDING  Incomplete  Blood culture (routine x 2)     Status: None (Preliminary result)   Collection Time: 01/30/15  1:20 PM  Result Value Ref Range Status   Specimen Description 4 BLOOD RIGHT  Final   Special Requests   Final    BOTTLES DRAWN AEROBIC AND ANAEROBIC  .5 CC AEROBIC, 2.5 CC ANAEROBIC   Culture NO GROWTH < 24 HOURS  Final   Report Status PENDING  Incomplete  MRSA PCR Screening     Status: None   Collection Time: 01/31/15 12:48 PM  Result Value Ref Range Status   MRSA by PCR NEGATIVE NEGATIVE Final    Comment:        The GeneXpert MRSA Assay (FDA approved for NASAL specimens only), is one component of a comprehensive MRSA colonization surveillance program. It is not intended to diagnose MRSA infection nor to guide or monitor treatment for MRSA infections.      Studies: No results found.  Scheduled Meds: . albumin human  25 g Intravenous Q8H  . feeding supplement (RESOURCE BREEZE)  1 Container Oral TID  WC  . folic acid  1 mg Oral Daily  . lactulose  30 g Oral TID  . levETIRAcetam  500 mg Oral BID  . multivitamin with minerals  1 tablet Oral Daily  . pantoprazole (PROTONIX) IV  40 mg Intravenous Q12H  . piperacillin-tazobactam  3.375 g Intravenous 3 times per day  . predniSONE  40 mg Oral Q breakfast  . QUEtiapine Fumarate  150 mg Oral QHS  . sodium chloride  3 mL Intravenous Q12H  . thiamine  100 mg Oral Daily   Or  . thiamine  100 mg Intravenous Daily  . vancomycin  1,000 mg Intravenous Q36H    Continuous Infusions: . sodium chloride 100 mL/hr at 02/01/15 0320  . norepinephrine      Assessment/Plan:  1 Altered mental status secondary to acute Hepatic encephalopathy and Cirrhosis of liver   -On lactulose and Prednisone  2 Leukocytosis: Possible sepsis - On Zosyn and Vanc  3 Elevated Troponin secondary to Demand ischemia  4 Seizure disorder: On Keppra  5 Acute Renal failure/ Hypotension with possible  Hepatorenal syndrome BP is better with hydration  6 Hyponatremia; Slowly improving- Continue to monitor    Code Status: Full  Family Communication: neice     Crystal [name]   02/01/2015, 8:21 AM  LOS: 2 days

## 2015-02-01 NOTE — Progress Notes (Signed)
[facility]  ROUNDING NOTE   Subjective:  Creatinine unable to be calculated due to bilirubin levels.  Niece and husband at bedside. Patient pleasantly confused.  UOP 900 Wbc 230.3 (21.7) K 4.6 (5.5) On NS at 100, IV albumin  Objective:  Vital signs in last 24 hours:  Temp:  [97.6 F (36.4 C)-99.6 F (37.6 C)] 97.6 F (36.4 C) (07/06 0730) Pulse Rate:  [108-119] 110 (07/06 0730) Resp:  [14-22] 16 (07/06 0730) BP: (87-110)/(46-96) 110/65 mmHg (07/06 0700) SpO2:  [88 %-96 %] 94 % (07/06 0730)  Weight change:  Filed Weights   01/30/15 1128  Weight: 67.132 kg (148 lb)    Intake/Output: I/O last 3 completed shifts: In: 4006.3 [I.V.:3406.3; IV Piggyback:600] Out: 1350 [Urine:1350]   Intake/Output this shift:  Total I/O In: 100 [IV Piggyback:100] Out: -   Physical Exam: General: Critically ill  Head: Normocephalic, atraumatic. Moist oral mucosal membranes  Eyes: +icterus  Neck: Supple, trachea midline  Lungs:  Clear to auscultation  Heart: Regular rate and rhythm  Abdomen:  Soft, nontender, +ascites  Extremities:  ++ peripheral edema.  Neurologic: Alert to self only, moving all four extremities  Skin: +jaundice       Basic Metabolic Panel:  Recent Labs Lab 01/30/15 1155 01/31/15 0406 02/01/15 0437  NA 128* 129* 131*  K 4.8 5.5* 4.6  CL 93* 99* 100*  CO2 23 21* 21*  GLUCOSE 133* 115* 112*  BUN 24* 26* 28*  CREATININE 2.98* NOT VALID SEE COMMENTS  CALCIUM 8.4* 7.9* 8.2*    Liver Function Tests:  Recent Labs Lab 01/30/15 1155 01/31/15 0406 02/01/15 0437  AST 395* 287* 175*  ALT 202* 171* 135*  ALKPHOS 175* 149* 132*  BILITOT 26.6* 24.0* 22.4*  PROT 6.7 6.4* 6.0*  ALBUMIN 2.3* 2.1* 2.3*    Recent Labs Lab 01/30/15 1155  LIPASE 34    Recent Labs Lab 01/30/15 1240  AMMONIA 109*    CBC:  Recent Labs Lab 01/30/15 1155 01/31/15 0406 02/01/15 0437  WBC 23.3* 21.7* 20.3*  NEUTROABS 17.1*  --  16.5*  HGB 11.3* 11.0*  10.4*  HCT 33.0* 32.3* 30.5*  MCV 101.5* 101.0* 101.8*  PLT 180 183 154    Cardiac Enzymes:  Recent Labs Lab 01/30/15 1155 01/30/15 1728 01/31/15 0406  TROPONINI 0.07* 0.07* <0.03    BNP: Invalid input(s): POCBNP  CBG: No results for input(s): GLUCAP in the last 168 hours.  Microbiology: Results for orders placed or performed during the hospital encounter of 01/30/15  Urine culture     Status: None (Preliminary result)   Collection Time: 01/30/15 12:41 PM  Result Value Ref Range Status   Specimen Description URINE, CATHETERIZED  Final   Special Requests Normal  Final   Culture NO GROWTH <24 HRS  Final   Report Status PENDING  Incomplete  Blood culture (routine x 2)     Status: None (Preliminary result)   Collection Time: 01/30/15  1:18 PM  Result Value Ref Range Status   Specimen Description RIGHT ANTECUBITAL  Final   Special Requests   Final    BOTTLES DRAWN AEROBIC AND ANAEROBIC  5 CC AEROBIC, .5 CC ANAEROBIC   Culture NO GROWTH < 24 HOURS  Final   Report Status PENDING  Incomplete  Blood culture (routine x 2)     Status: None (Preliminary result)   Collection Time: 01/30/15  1:20 PM  Result Value Ref Range Status   Specimen Description 4 BLOOD RIGHT  Final  Special Requests   Final    BOTTLES DRAWN AEROBIC AND ANAEROBIC  .5 CC AEROBIC, 2.5 CC ANAEROBIC   Culture NO GROWTH < 24 HOURS  Final   Report Status PENDING  Incomplete  MRSA PCR Screening     Status: None   Collection Time: 01/31/15 12:48 PM  Result Value Ref Range Status   MRSA by PCR NEGATIVE NEGATIVE Final    Comment:        The GeneXpert MRSA Assay (FDA approved for NASAL specimens only), is one component of a comprehensive MRSA colonization surveillance program. It is not intended to diagnose MRSA infection nor to guide or monitor treatment for MRSA infections.     Coagulation Studies:  Recent Labs  01/30/15 1600 01/31/15 0406 02/01/15 0437  LABPROT 26.2* 26.0* 25.6*  INR 2.39  2.37 2.32    Urinalysis:  Recent Labs  01/30/15 1241  COLORURINE AMBER*  LABSPEC 1.019  PHURINE 5.0  GLUCOSEU 50*  HGBUR 1+*  BILIRUBINUR 2+*  KETONESUR NEGATIVE  PROTEINUR 30*  NITRITE NEGATIVE  LEUKOCYTESUR NEGATIVE      Imaging: Ct Head Wo Contrast  01/30/2015   CLINICAL DATA:  Loss of motor skills over the last cerebral days. Fevers. Mental status changes. History of aneurysm.  EXAM: CT HEAD WITHOUT CONTRAST  TECHNIQUE: Contiguous axial images were obtained from the base of the skull through the vertex without intravenous contrast.  COMPARISON:  10/27/2014 and multiple previous  FINDINGS: Previous right-sided pterional craniotomy. No aneurysm clip is visible by CT. The brain shows mild volume loss without evidence of old or acute focal infarction, mass lesion, hemorrhage, hydrocephalus or extra-axial collection. No acute calvarial finding. Sinuses, middle ears and mastoids are clear.  IMPRESSION: No acute or reversible finding. Previous pterional craniotomy on the right. Mild brain atrophy without focal finding.   Electronically Signed   By: Paulina FusiMark  Shogry M.D.   On: 01/30/2015 13:00   [place] Abdomen Complete  01/30/2015   CLINICAL DATA:  Abdominal pain. Evaluate biliary tract and evaluate for ascites. Cirrhosis. Pancreatitis.  EXAM: ULTRASOUND ABDOMEN COMPLETE  COMPARISON:  CT of 08/30/2014  FINDINGS: Gallbladder: Minimal gallbladder sludge. Gallbladder wall thickening, 8 mm. Pericholecystic fluid. Sonographic Murphy's sign was not elicited.  Common bile duct: Diameter: Normal, 5 mm.  Liver: Moderate cirrhosis.  No focal liver lesion.  IVC: No abnormality visualized.  Pancreas: Not visualized due to overlying bowel gas.  Spleen: Splenomegaly, with the splenic volume of 480 cc.  Right Kidney: Length: 10.0 cm. Echogenicity within normal limits. No mass or hydronephrosis visualized.  Left Kidney: Length: 10.7 cm. Echogenicity within normal limits. No mass or hydronephrosis visualized.  Abdominal  aorta: No aneurysm visualized.  Other findings: Small volume ascites, primarily identified adjacent the liver.  Heterogeneous flow within the portal vein, partially hepatofugal.  Mild degradation secondary to overlying bowel gas.  IMPRESSION: 1. Cirrhosis with portal venous hypertension, splenomegaly, and heterogeneous portal venous flow. 2. Gallbladder sludge with nonspecific gallbladder wall thickening (in the setting of portal venous hypertension) and pericholecystic fluid. No sonographic Murphy sign to confirm acute cholecystitis. 3. Small volume abdominal ascites. 4. Mild limitations secondary to overlying bowel gas.   Electronically Signed   By: Jeronimo GreavesKyle  Talbot M.D.   On: 01/30/2015 15:07   Dg Chest Portable 1 View  01/30/2015   CLINICAL DATA:  Generalize weakness. History of brain aneurysm. Smoker. Cirrhosis.  EXAM: PORTABLE CHEST - 1 VIEW  COMPARISON:  10/27/2014  FINDINGS: Midline trachea. Normal heart size. Mild right hemidiaphragm elevation.  No pleural effusion or pneumothorax. New patchy right worse than left lower lung airspace disease.  IMPRESSION: New bibasilar opacities, favored to represent atelectasis. Early pneumonia felt less likely. Consider radiographic followup at 1-2 days.   Electronically Signed   By: Jeronimo Greaves M.D.   On: 01/30/2015 13:05     Medications:   . sodium chloride 100 mL/hr at 02/01/15 0320  . norepinephrine     . albumin human  25 g Intravenous Q8H  . feeding supplement (RESOURCE BREEZE)  1 Container Oral TID WC  . folic acid  1 mg Oral Daily  . lactulose  30 g Oral TID  . levETIRAcetam  500 mg Oral BID  . multivitamin with minerals  1 tablet Oral Daily  . pantoprazole (PROTONIX) IV  40 mg Intravenous Q12H  . piperacillin-tazobactam  3.375 g Intravenous 3 times per day  . predniSONE  40 mg Oral Q breakfast  . QUEtiapine Fumarate  150 mg Oral QHS  . sodium chloride  3 mL Intravenous Q12H  . thiamine  100 mg Oral Daily   Or  . thiamine  100 mg Intravenous  Daily  . vancomycin  1,000 mg Intravenous Q36H   albuterol, ipratropium-albuterol, LORazepam, morphine injection, ondansetron **OR** ondansetron (ZOFRAN) IV, oxyCODONE  Assessment/ Plan:  Crystal [name] is a 44 y.o. white female with cirrhosis of the liver, cerebral aneurysm, pancreatitis, bipolar disorder, seizure disorder, COPD, GERD, history of suicide attempt, who was admitted to [facility] on 01/30/2015    1. Acute renal failure unspecified with hyperkalemia: Nonoliguric renal failure. Concerned for prerenal azotemia versus acute hepatorenal syndrome. Urine output improved with NS and IV albumin.  - Discussed dialysis with family who is willing to undergo the procedure. However patient currently critically ill and unclear if this will help with overall prognosis.  - monitor potassium levels. Low K diet.   2. Hyponatremia: most likely due to end stage liver disease with cirrhosis.  - improved to 131.   3. Anemia unspecified D64.9: hemoglobin remains stable 10.4   LOS: 2 Crystal [name] 7/6/20168:27 AM

## 2015-02-01 NOTE — Progress Notes (Signed)
   02/01/15 1400  Clinical Encounter Type  Visited With Patient;Family  Visit Type Initial;Spiritual support  Consult/Referral To Chaplain  Spiritual Encounters  Spiritual Needs Brochure;Emotional  Stress Factors  Family Stress Factors Health changes  Chaplain met with family in cafeteria and offered a compassionate presence. Later rounded and visited with family and patient. Patient was resting at the time. Provided a copy of HCPOA/LW packet to family of patient for review. Chaplain Keta A. Brigid Vandekamp Ext. [phone]

## 2015-02-01 NOTE — Progress Notes (Signed)
Scheduled CIWA scale was completed at 20:00 with no signs of agitation, anxiety, etc. At that time.  Pt was sleeping.  But one hour later, pt was startled awake, became very agitated, anxious. Began pulling at foley catheter, trying to get OOB, . Ativan was given.  CIWA scale reassessed.

## 2015-02-01 NOTE — Progress Notes (Signed)
ANTIBIOTIC CONSULT NOTE - FOLLOW UP  Pharmacy Consult for Vancomycin Indication: sepsis  Allergies  Allergen Reactions  . Oxycontin [Oxycodone Hcl] Itching  . Codeine Itching    Other reaction(s): Unknown    Patient Measurements: Height:  (160 cm) Weight: 148 lb (67.132 kg) IBW/kg (Calculated) : 52.4   Vital Signs: Temp: 98.6 F (37 C) (07/05 2200) Temp Source: Oral (07/05 2200) BP: 100/59 mmHg (07/06 0200) Pulse Rate: 114 (07/06 0200) Intake/Output from previous day: 07/05 0701 - 07/06 0700 In: 1755 [I.V.:1505; IV Piggyback:250] Out: 650 [Urine:650] Intake/Output from this shift: Total I/O In: 355 [I.V.:255; IV Piggyback:100] Out: -   Labs:  Recent Labs  01/30/15 1155 01/31/15 0406 02/01/15 0437  WBC 23.3* 21.7*  --   HGB 11.3* 11.0*  --   PLT 180 183  --   CREATININE 2.98* NOT VALID SEE COMMENTS   CrCl cannot be calculated (Patient has no serum creatinine result on file.).  Recent Labs  02/01/15 0437  [facility] 7     Microbiology: Recent Results (from the past 720 hour(s))  Urine culture     Status: None (Preliminary result)   Collection Time: 01/30/15 12:41 PM  Result Value Ref Range Status   Specimen Description URINE, CATHETERIZED  Final   Special Requests Normal  Final   Culture NO GROWTH <24 HRS  Final   Report Status PENDING  Incomplete  Blood culture (routine x 2)     Status: None (Preliminary result)   Collection Time: 01/30/15  1:18 PM  Result Value Ref Range Status   Specimen Description RIGHT ANTECUBITAL  Final   Special Requests   Final    BOTTLES DRAWN AEROBIC AND ANAEROBIC  5 CC AEROBIC, .5 CC ANAEROBIC   Culture NO GROWTH < 24 HOURS  Final   Report Status PENDING  Incomplete  Blood culture (routine x 2)     Status: None (Preliminary result)   Collection Time: 01/30/15  1:20 PM  Result Value Ref Range Status   Specimen Description 4 BLOOD RIGHT  Final   Special Requests   Final    BOTTLES DRAWN AEROBIC AND ANAEROBIC  .5  CC AEROBIC, 2.5 CC ANAEROBIC   Culture NO GROWTH < 24 HOURS  Final   Report Status PENDING  Incomplete  MRSA PCR Screening     Status: None   Collection Time: 01/31/15 12:48 PM  Result Value Ref Range Status   MRSA by PCR NEGATIVE NEGATIVE Final    Comment:        The GeneXpert MRSA Assay (FDA approved for NASAL specimens only), is one component of a comprehensive MRSA colonization surveillance program. It is not intended to diagnose MRSA infection nor to guide or monitor treatment for MRSA infections.     Anti-infectives    Start     Dose/Rate Route Frequency Ordered Stop   02/01/15 0600  vancomycin (VANCOCIN) IVPB 1000 mg/200 mL premix     1,000 mg 200 mL/hr over 60 Minutes Intravenous Every 36 hours 01/30/15 2002     01/30/15 2200  piperacillin-tazobactam (ZOSYN) IVPB 3.375 g     3.375 g 12.5 mL/hr over 240 Minutes Intravenous 3 times per day 01/30/15 1738     01/30/15 1630  piperacillin-tazobactam (ZOSYN) IVPB 3.375 g  Status:  Discontinued     3.375 g 100 mL/hr over 30 Minutes Intravenous 3 times per day 01/30/15 1618 01/30/15 1738   01/30/15 1515  vancomycin (VANCOCIN) IVPB 1000 mg/200 mL premix  1,000 mg 200 mL/hr over 60 Minutes Intravenous  Once 01/30/15 1500 01/30/15 1647   01/30/15 1445  piperacillin-tazobactam (ZOSYN) IVPB 3.375 g     3.375 g 12.5 mL/hr over 240 Minutes Intravenous  Once 01/30/15 1436 01/30/15 1547      Assessment: Patient being treated empirically for leukocytosis with unclear source of infection. Vancomycin level checked 36 h post-dose is 7.  Goal of Therapy:  Vancomycin trough level 15-20 mcg/ml  Plan:  Continue with vancomycin 1000 mg iv q 36 h as ordered. Will follow renal function closely and check levels and/or change dosing as indicated.   Crystal [name], Crystal [name] 02/01/2015,5:32 AM

## 2015-02-01 NOTE — Progress Notes (Signed)
Pt became very agitated, was trying to get out of the bed and using foul language. Reassessed CIWA. Will continue to reassess.

## 2015-02-02 ENCOUNTER — Inpatient Hospital Stay: Payer: Medicaid Other

## 2015-02-02 DIAGNOSIS — K7010 Alcoholic hepatitis without ascites: Secondary | ICD-10-CM | POA: Insufficient documentation

## 2015-02-02 DIAGNOSIS — K7200 Acute and subacute hepatic failure without coma: Secondary | ICD-10-CM | POA: Insufficient documentation

## 2015-02-02 LAB — COMPREHENSIVE METABOLIC PANEL
ALBUMIN: 3.3 g/dL — AB (ref 3.5–5.0)
ALK PHOS: 114 U/L (ref 38–126)
ALT: 106 U/L — ABNORMAL HIGH (ref 14–54)
AST: 131 U/L — AB (ref 15–41)
Anion gap: 7 (ref 5–15)
BILIRUBIN TOTAL: 22.5 mg/dL — AB (ref 0.3–1.2)
BUN: 21 mg/dL — ABNORMAL HIGH (ref 6–20)
CHLORIDE: 102 mmol/L (ref 101–111)
CO2: 24 mmol/L (ref 22–32)
Calcium: 8.7 mg/dL — ABNORMAL LOW (ref 8.9–10.3)
Creatinine, Ser: UNDETERMINED mg/dL (ref 0.44–1.00)
Glucose, Bld: 96 mg/dL (ref 65–99)
POTASSIUM: 3.9 mmol/L (ref 3.5–5.1)
SODIUM: 133 mmol/L — AB (ref 135–145)
Total Protein: 6.5 g/dL (ref 6.5–8.1)

## 2015-02-02 LAB — CBC WITH DIFFERENTIAL/PLATELET
Basophils Absolute: 0.1 10*3/uL (ref 0–0.1)
Basophils Relative: 1 %
EOS ABS: 0.2 10*3/uL (ref 0–0.7)
Eosinophils Relative: 1 %
HEMATOCRIT: 30 % — AB (ref 35.0–47.0)
Hemoglobin: 10.3 g/dL — ABNORMAL LOW (ref 12.0–16.0)
LYMPHS ABS: 1.2 10*3/uL (ref 1.0–3.6)
LYMPHS PCT: 9 %
MCH: 34.8 pg — ABNORMAL HIGH (ref 26.0–34.0)
MCHC: 34.3 g/dL (ref 32.0–36.0)
MCV: 101.7 fL — AB (ref 80.0–100.0)
MONO ABS: 1.6 10*3/uL — AB (ref 0.2–0.9)
Monocytes Relative: 12 %
Neutro Abs: 10.8 10*3/uL — ABNORMAL HIGH (ref 1.4–6.5)
Neutrophils Relative %: 77 %
PLATELETS: 130 10*3/uL — AB (ref 150–440)
RBC: 2.95 MIL/uL — ABNORMAL LOW (ref 3.80–5.20)
RDW: 15.8 % — ABNORMAL HIGH (ref 11.5–14.5)
WBC: 13.9 10*3/uL — AB (ref 3.6–11.0)

## 2015-02-02 MED ORDER — DEXMEDETOMIDINE HCL IN NACL 400 MCG/100ML IV SOLN
0.4000 ug/kg/h | INTRAVENOUS | Status: DC
  Administered 2015-02-02: 1.401 ug/kg/h via INTRAVENOUS
  Administered 2015-02-02 – 2015-02-03 (×2): 1.2 ug/kg/h via INTRAVENOUS
  Administered 2015-02-03: 1.1 ug/kg/h via INTRAVENOUS
  Administered 2015-02-03: 0.8 ug/kg/h via INTRAVENOUS
  Administered 2015-02-04: 1.198 ug/kg/h via INTRAVENOUS
  Administered 2015-02-05: 0.7 ug/kg/h via INTRAVENOUS
  Administered 2015-02-06: 0.8 ug/kg/h via INTRAVENOUS
  Filled 2015-02-02 (×11): qty 100

## 2015-02-02 NOTE — Progress Notes (Signed)
PROGRESS NOTE  Crystal [name]:[id number] DOB: [birth year] DOA: 01/30/2015 PCP: Barbette Reichmann, MD  Subjective:  44 y/o F with hx of Alcohol abuse, Bipolar disorder, Seizures from alcohol withdrawal,Anxiety, ADHD admitted with abdominal pain and altered mental status with increased lethargy consistent with Hepatic encephalopathy. Had a bowel movement yesterday  after she received an enema   Consultants: GI- Dr. Ricki Rodriguez   Objective: BP 109/66 mmHg  Pulse 93  Temp(Src) 98.1 F (36.7 C) (Oral)  Resp 14  Ht  (1.6 m)  Wt 67.132 kg (148 lb)  BMI 26.22 kg/m2  SpO2 94%  Intake/Output Summary (Last 24 hours) at 02/02/15 0827 Last data filed at 02/02/15 0600  Gross per 24 hour  Intake   2850 ml  Output    600 ml  Net   2250 ml   Filed Weights   01/30/15 1128  Weight: 67.132 kg (148 lb)    Exam:   General: Jaundiced. Drowsy  Spider nevi and peteciae noted   Cardiovascular: S1 S2   Respiratory: Clear to auscultation  Abdomen: distended/ Ascites +  Neuro:Speech is slurred  Data Reviewed: Basic Metabolic Panel:  Recent Labs Lab 01/30/15 1155 01/31/15 0406 02/01/15 0437  NA 128* 129* 131*  K 4.8 5.5* 4.6  CL 93* 99* 100*  CO2 23 21* 21*  GLUCOSE 133* 115* 112*  BUN 24* 26* 28*  CREATININE 2.98* NOT VALID SEE COMMENTS  CALCIUM 8.4* 7.9* 8.2*   Liver Function Tests:  Recent Labs Lab 01/30/15 1155 01/31/15 0406 02/01/15 0437  AST 395* 287* 175*  ALT 202* 171* 135*  ALKPHOS 175* 149* 132*  BILITOT 26.6* 24.0* 22.4*  PROT 6.7 6.4* 6.0*  ALBUMIN 2.3* 2.1* 2.3*    Recent Labs Lab 01/30/15 1155  LIPASE 34    Recent Labs Lab 01/30/15 1240 02/01/15 1602  AMMONIA 109* 91*   CBC:  Recent Labs Lab 01/30/15 1155 01/31/15 0406 02/01/15 0437 02/02/15 0650  WBC 23.3* 21.7* 20.3* 13.9*  NEUTROABS 17.1*  --  16.5* 10.8*  HGB 11.3* 11.0* 10.4* 10.3*  HCT 33.0* 32.3* 30.5* 30.0*  MCV 101.5* 101.0* 101.8* 101.7*  PLT 180 183 154 130*    Cardiac Enzymes:    Recent Labs Lab 01/30/15 1155 01/30/15 1728 01/31/15 0406  TROPONINI 0.07* 0.07* <0.03   BNP (last 3 results) No results for input(s): BNP in the last 8760 hours.  ProBNP (last 3 results) No results for input(s): PROBNP in the last 8760 hours.  CBG: No results for input(s): GLUCAP in the last 168 hours.  Recent Results (from the past 240 hour(s))  Urine culture     Status: None   Collection Time: 01/30/15 12:41 PM  Result Value Ref Range Status   Specimen Description URINE, CATHETERIZED  Final   Special Requests Normal  Final   Culture NO GROWTH 1 DAY  Final   Report Status 02/01/2015 FINAL  Final  Blood culture (routine x 2)     Status: None (Preliminary result)   Collection Time: 01/30/15  1:18 PM  Result Value Ref Range Status   Specimen Description RIGHT ANTECUBITAL  Final   Special Requests   Final    BOTTLES DRAWN AEROBIC AND ANAEROBIC  5 CC AEROBIC, .5 CC ANAEROBIC   Culture NO GROWTH < 24 HOURS  Final   Report Status PENDING  Incomplete  Blood culture (routine x 2)     Status: None (Preliminary result)   Collection Time: 01/30/15  1:20 PM  Result Value Ref  Range Status   Specimen Description 4 BLOOD RIGHT  Final   Special Requests   Final    BOTTLES DRAWN AEROBIC AND ANAEROBIC  .5 CC AEROBIC, 2.5 CC ANAEROBIC   Culture NO GROWTH < 24 HOURS  Final   Report Status PENDING  Incomplete  MRSA PCR Screening     Status: None   Collection Time: 01/31/15 12:48 PM  Result Value Ref Range Status   MRSA by PCR NEGATIVE NEGATIVE Final    Comment:        The GeneXpert MRSA Assay (FDA approved for NASAL specimens only), is one component of a comprehensive MRSA colonization surveillance program. It is not intended to diagnose MRSA infection nor to guide or monitor treatment for MRSA infections.      Studies: No results found.  Scheduled Meds: . albumin human  25 g Intravenous Q8H  . feeding supplement (RESOURCE BREEZE)  1 Container Oral  TID WC  . folic acid  1 mg Oral Daily  . lactulose  30 g Oral TID  . levETIRAcetam  500 mg Oral BID  . multivitamin with minerals  1 tablet Oral Daily  . pantoprazole (PROTONIX) IV  40 mg Intravenous Q12H  . piperacillin-tazobactam  3.375 g Intravenous 3 times per day  . predniSONE  40 mg Oral Q breakfast  . QUEtiapine Fumarate  150 mg Oral QHS  . sodium chloride  3 mL Intravenous Q12H  . thiamine  100 mg Oral Daily  . vancomycin  1,000 mg Intravenous Q36H    Continuous Infusions: . sodium chloride 100 mL/hr at 02/01/15 1741  . norepinephrine      Assessment/Plan:  1 Altered mental status secondary to acute Hepatic encephalopathy and Cirrhosis of liver   -On lactulose and Prednisone  LFT's improving 2 Leukocytosis: Possible sepsis - On Zosyn and Vanc  3 Elevated Troponin secondary to Demand ischemia  4 Seizure disorder: On Keppra  5 Acute Renal failure/ Hypotension and Metabolic acidosis  with possible  Hepatorenal syndrome Continue IV Fluids 6 Hyponatremia; Slowly improving- Continue to monitor Transfer to floor with tele     Code Status: Full  Family Communication: neice     Leiliana Foody   02/02/2015, 8:27 AM  LOS: 3 days

## 2015-02-02 NOTE — Care Management Note (Signed)
Case Management Note  Patient Details  Name: Crystal [name] MRN: [id number] Date of Birth: October 30, 1970  Subjective/Objective:   Met with niece and spouse at bedside. They have been made aware that patient may not recover during this hospitalization. Niece tearful. Palliative consult pending. Explained the role of Palliative and emotional support given.  Family expressed their desire for continued aggressive care at this time. Case discussed with primary nurse. Following progression            Action/Plan:   Expected Discharge Date:                  Expected Discharge Plan:     In-House Referral:     Discharge planning Services     Post Acute Care Choice:    Choice offered to:     DME Arranged:    DME Agency:     HH Arranged:    [place] Agency:     Status of Service:     Medicare Important Message Given:    Date Medicare IM Given:    Medicare IM give by:    Date Additional Medicare IM Given:    Additional Medicare Important Message give by:     If discussed at [place] of Stay Meetings, dates discussed:    Additional Comments:  Jolly Mango, RN 02/02/2015, 10:39 AM

## 2015-02-02 NOTE — Progress Notes (Signed)
ANTIBIOTIC CONSULT NOTE - FOLLOW UP  Pharmacy Consult for Vancomycin Indication: sepsis  Allergies  Allergen Reactions  . Oxycontin [Oxycodone Hcl] Itching  . Codeine Itching    Other reaction(s): Unknown    Patient Measurements: Height: 5\' 3"  (160 cm) Weight: 148 lb (67.132 kg) IBW/kg (Calculated) : 52.4   Vital Signs: Temp: 98.1 F (36.7 C) (07/07 0800) Temp Source: Oral (07/07 0800) BP: 100/79 mmHg (07/07 0800) Pulse Rate: 94 (07/07 0800) Intake/Output from previous day: 07/06 0701 - 07/07 0700 In: 2950 [I.V.:2500; IV Piggyback:450] Out: 600 [Urine:600] Intake/Output from this shift: Total I/O In: 100 [IV Piggyback:100] Out: -   Labs:  Recent Labs  01/30/15 1155 01/31/15 0406 02/01/15 0437 02/02/15 0650  WBC 23.3* 21.7* 20.3* 13.9*  HGB 11.3* 11.0* 10.4* 10.3*  PLT 180 183 154 130*  CREATININE 2.98* NOT VALID SEE COMMENTS  --    CrCl cannot be calculated (Patient has no serum creatinine result on file.).  [facility] Labs  [facility]15 04[facility]     Microbiology: Recent Results (from the past 720 hour(s))  Urine culture     Status: None   Collection Time: 01/30/15 12:41 PM  Result Value Ref Range Status   Specimen Description URINE, CATHETERIZED  Final   Special Requests Normal  Final   Culture NO GROWTH 1 DAY  Final   Report Status 02/01/2015 FINAL  Final  Blood culture (routine x 2)     Status: None (Preliminary result)   Collection Time: 01/30/15  1:18 PM  Result Value Ref Range Status   Specimen Description RIGHT ANTECUBITAL  Final   Special Requests   Final    BOTTLES DRAWN AEROBIC AND ANAEROBIC  5 CC AEROBIC, .5 CC ANAEROBIC   Culture NO GROWTH < 24 HOURS  Final   Report Status PENDING  Incomplete  Blood culture (routine x 2)     Status: None (Preliminary result)   Collection Time: 01/30/15  1:20 PM  Result Value Ref Range Status   Specimen Description 4 BLOOD RIGHT  Final   Special Requests   Final    BOTTLES DRAWN AEROBIC AND  ANAEROBIC  .5 CC AEROBIC, 2.5 CC ANAEROBIC   Culture NO GROWTH < 24 HOURS  Final   Report Status PENDING  Incomplete  MRSA PCR Screening     Status: None   Collection Time: 01/31/15 12:48 PM  Result Value Ref Range Status   MRSA by PCR NEGATIVE NEGATIVE Final    Comment:        The GeneXpert MRSA Assay (FDA approved for NASAL specimens only), is one component of a comprehensive MRSA colonization surveillance program. It is not intended to diagnose MRSA infection nor to guide or monitor treatment for MRSA infections.     Anti-infectives    Start     Dose/Rate Route Frequency Ordered Stop   02/01/15 0600  vancomycin (VANCOCIN) IVPB 1000 mg/200 mL premix     1,000 mg 200 mL/hr over 60 Minutes Intravenous Every 36 hours 01/30/15 2002     01/30/15 2200  piperacillin-tazobactam (ZOSYN) IVPB 3.375 g     3.375 g 12.5 mL/hr over 240 Minutes Intravenous 3 times per day 01/30/15 1738     01/30/15 1630  piperacillin-tazobactam (ZOSYN) IVPB 3.375 g  Status:  Discontinued     3.375 g 100 mL/hr over 30 Minutes Intravenous 3 times per day 01/30/15 1618 01/30/15 1738   01/30/15 1515  vancomycin (VANCOCIN) IVPB 1000 mg/200 mL premix  1,000 mg 200 mL/hr over 60 Minutes Intravenous  Once 01/30/15 1500 01/30/15 1647   01/30/15 1445  piperacillin-tazobactam (ZOSYN) IVPB 3.375 g     3.375 g 12.5 mL/hr over 240 Minutes Intravenous  Once 01/30/15 1436 01/30/15 1547      Assessment: 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  Patient's serum creatinine levels are difficult to quantify. Last known serum creatinine was 2.98 after dilution on 7/4. Patient was dosed based on that serum creatinine.    Goal of Therapy:  Vancomycin trough level 15-20 mcg/ml  Plan:  Continue with vancomycin 1000 mg iv q 36 h as ordered. Will follow renal function closely and check levels and/or change dosing as indicated. Vancomycin level ordered  to be drawn prior to the 0600 dose on 7/9. Level will not be @ [facility].   Jihan Mellette D 02/02/2015,8:56 AM

## 2015-02-02 NOTE — Progress Notes (Signed)
Pt has become increasingly agitated. Precedex was started at 1.2 mcg. Pt was still having issues with agitation and trying to crawl out of the bed. Kasa advised to increase Precedex to 1.4 mcg and use Ativan to supplement for agitation.

## 2015-02-02 NOTE — Progress Notes (Signed)
Pt on Precedex drip and not able to tolerate PO meds. Spoke with Dr. Dan HumphreysWalker and orders to hold PO meds until morning were given.

## 2015-02-02 NOTE — Care Management Note (Signed)
Case Management Note  Patient Details  Name: Crystal [name] MRN: [id number] Date of Birth: January 04, 1971  Subjective/Objective:    Plan was transfer to the floor today. This was changed when patient began having what is thought to be DT's. Following.                  Action/Plan:   Expected Discharge Date:                  Expected Discharge Plan:     In-House Referral:     Discharge planning Services     Post Acute Care Choice:    Choice offered to:     DME Arranged:    DME Agency:     HH Arranged:    HH Agency:     Status of Service:     Medicare Important Message Given:    Date Medicare IM Given:    Medicare IM give by:    Date Additional Medicare IM Given:    Additional Medicare Important Message give by:     If discussed at [place] of Stay Meetings, dates discussed:    Additional Comments:  Marily MemosLisa M Brettney Ficken, RN 02/02/2015, 2:53 PM

## 2015-02-02 NOTE — Clinical Social Work Note (Signed)
CSW attempted to see pt, however she was sleeping. Husband at bedside. Pt on CIWA. Palliative Care consult pending. CSW will continue to follow.   Dede QuerySarah Josee Speece, MSW, LCSW Clinical Social Worker [phone]

## 2015-02-02 NOTE — Procedures (Signed)
Central Venous Catheter Placement: Indication: Patient receiving vesicant or irritant drug.; Patient receiving intravenous therapy for longer than 5 days.; Patient has limited or no vascular access.   Consent:emergent Risks and benefits explained in detail including risk of infection, bleeding, respiratory failure and death..   Hand washing performed prior to starting the procedure.   Procedure: An active timeout was performed and correct patient, name, & ID confirmed.  After explaining risk and benefits, patient was positioned correctly for central venous access. Patient was prepped using strict sterile technique including chlorohexadine preps, sterile drape, sterile gown and sterile gloves.  The area was prepped, draped and anesthetized in the usual sterile manner. Patient comfort was obtained.  A triple lumen catheter was placed in Left Internal Jugular Vein There was good blood return, catheter caps were placed on lumens, catheter flushed easily, the line was secured and a sterile dressing and BIO-PATCH applied.   Ultrasound was used to visualize vasculature and guidance of needle.   Number of Attempts: 1 Complications:none Estimated Blood Loss: none Chest Radiograph indicated and ordered.  Procedure time:10 mins Operator: Rosalyn Archambault.   Lucie LeatherKurian David Trachelle Low, M.D. Pulmonary & Critical Care Medicine [facility] Medical Director Intensive Care Unit

## 2015-02-02 NOTE — Consult Note (Signed)
Subjective: Patient seen for etoh hepatitis, subacute liver failure, hepatic encephalopathy.  Patien minimally resopnsive but arousable.    Objective: Vital signs in last 24 hours: Temp:  [98.1 F (36.7 C)-98.8 F (37.1 C)] 98.1 F (36.7 C) (07/07 0800) Pulse Rate:  [93-110] 103 (07/07 1400) Resp:  [14-30] 18 (07/07 1400) BP: (85-128)/(54-111) 105/75 mmHg (07/07 1400) SpO2:  [86 %-99 %] 99 % (07/07 1400) Blood pressure 105/75, pulse 103, temperature 98.1 F (36.7 C), temperature source Oral, resp. rate 18, height 5\' 3"  (1.6 m), weight 67.132 kg (148 lb), SpO2 99 %.   Intake/Output from previous day: 07/06 0701 - 07/07 0700 In: 2950 [I.V.:2500; IV Piggyback:450] Out: 600 [Urine:600]  Intake/Output this shift: Total I/O In: 734.7 [I.V.:634.7; IV Piggyback:100] Out: -    General appearance:  Deeply jaundiced, drowsy, somulent Resp:  cta Cardio:  Rrr, mild tachycardia GI:  Moderate distension, bowel sounds positive, apparently non tender.  Extremities:  No cce   Lab Results: Results for orders placed or performed during the hospital encounter of 01/30/15 (from the past 24 hour(s))  Ammonia     Status: Abnormal   Collection Time: 02/01/15  4:02 PM  Result Value Ref Range   Ammonia 91 (H) 9 - 35 umol/L  CBC with Differential/Platelet     Status: Abnormal   Collection Time: 02/02/15  6:50 AM  Result Value Ref Range   WBC 13.9 (H) 3.6 - 11.0 K/uL   RBC 2.95 (L) 3.80 - 5.20 MIL/uL   Hemoglobin 10.3 (L) 12.0 - 16.0 g/dL   HCT 29.530.0 (L) 62.135.0 - 30.847.0 %   MCV 101.7 (H) 80.0 - 100.0 fL   MCH 34.8 (H) 26.0 - 34.0 pg   MCHC 34.3 32.0 - 36.0 g/dL   RDW 65.715.8 (H) 84.611.5 - 96.214.5 %   Platelets 130 (L) 150 - 440 K/uL   Neutrophils Relative % 77 %   Neutro Abs 10.8 (H) 1.4 - 6.5 K/uL   Lymphocytes Relative 9 %   Lymphs Abs 1.2 1.0 - 3.6 K/uL   Monocytes Relative 12 %   Monocytes Absolute 1.6 (H) 0.2 - 0.9 K/uL   Eosinophils Relative 1 %   Eosinophils Absolute 0.2 0 - 0.7 K/uL   Basophils Relative 1 %   Basophils Absolute 0.1 0 - 0.1 K/uL  Comprehensive metabolic panel     Status: Abnormal   Collection Time: 02/02/15  6:50 AM  Result Value Ref Range   Sodium 133 (L) 135 - 145 mmol/L   Potassium 3.9 3.5 - 5.1 mmol/L   Chloride 102 101 - 111 mmol/L   CO2 24 22 - 32 mmol/L   Glucose, Bld 96 65 - 99 mg/dL   BUN 21 (H) 6 - 20 mg/dL   Creatinine, Ser UNABLE TO CALCULATE DUE TO ICTERUS 0.44 - 1.00 mg/dL   Calcium 8.7 (L) 8.9 - 10.3 mg/dL   Total Protein 6.5 6.5 - 8.1 g/dL   Albumin 3.3 (L) 3.5 - 5.0 g/dL   AST 952131 (H) 15 - 41 U/L   ALT 106 (H) 14 - 54 U/L   Alkaline Phosphatase 114 38 - 126 U/L   Total Bilirubin 22.5 (HH) 0.3 - 1.2 mg/dL   GFR calc non Af Amer NOT CALCULATED >60 mL/min   GFR calc Af Amer NOT CALCULATED >60 mL/min   Anion gap 7 5 - 15      Recent Labs  01/31/15 0406 02/01/15 0437 02/02/15 0650  WBC 21.7* 20.3* 13.9*  HGB 11.0* 10.4*  10.3*  HCT 32.3* 30.5* 30.0*  PLT 183 154 130*   BMET  Recent Labs  01/31/15 0406 02/01/15 0437 02/02/15 0650  NA 129* 131* 133*  K 5.5* 4.6 3.9  CL 99* 100* 102  CO2 21* 21* 24  GLUCOSE 115* 112* 96  BUN 26* 28* 21*  CREATININE NOT VALID SEE COMMENTS UNABLE TO CALCULATE DUE TO ICTERUS  CALCIUM 7.9* 8.2* 8.7*   LFT  Recent Labs  02/02/15 0650  PROT 6.5  ALBUMIN 3.3*  AST 131*  ALT 106*  ALKPHOS 114  BILITOT 22.5*   PT/INR  Recent Labs  01/31/15 0406 02/01/15 0437  LABPROT 26.0* 25.6*  INR 2.37 2.32   Hepatitis Panel No results for input(s): HEPBSAG, HCVAB, HEPAIGM, HEPBIGM in the last 72 hours. C-Diff No results for input(s): CDIFFTOX in the last 72 hours. No results for input(s): CDIFFPCR in the last 72 hours.   Studies/Results: No results found.  Scheduled Inpatient Medications:   . albumin human  25 g Intravenous Q8H  . feeding supplement (RESOURCE BREEZE)  1 Container Oral TID WC  . folic acid  1 mg Oral Daily  . lactulose  30 g Oral TID  . levETIRAcetam  500 mg  Oral BID  . multivitamin with minerals  1 tablet Oral Daily  . pantoprazole (PROTONIX) IV  40 mg Intravenous Q12H  . piperacillin-tazobactam  3.375 g Intravenous 3 times per day  . predniSONE  40 mg Oral Q breakfast  . QUEtiapine Fumarate  150 mg Oral QHS  . sodium chloride  3 mL Intravenous Q12H  . thiamine  100 mg Oral Daily  . vancomycin  1,000 mg Intravenous Q36H    Continuous Inpatient Infusions:   . sodium chloride 100 mL/hr at 02/02/15 1219  . norepinephrine      PRN Inpatient Medications:  albuterol, ipratropium-albuterol, LORazepam, morphine injection, ondansetron **OR** ondansetron (ZOFRAN) IV, oxyCODONE  Miscellaneous:   Assessment:  1) etoh abuse 2) etoh hepatopathy-mild improvement of labs.  3) ARF/ possible HRS-renal assistance appreciated.   Plan:  1) continue current, on prednisone,abx,  ulcer prophylaxis. following  Christena Deem MD 02/02/2015, 3:37 PM

## 2015-02-02 NOTE — Progress Notes (Signed)
[facility]  ROUNDING NOTE   Subjective:  Pt seen at bedside. UOP 850cc over the past 3 shifts. Pt is arousable today.   Objective:  Vital signs in last 24 hours:  Temp:  [98 F (36.7 C)-98.8 F (37.1 C)] 98.1 F (36.7 C) (07/07 0800) Pulse Rate:  [93-110] 106 (07/07 1000) Resp:  [14-21] 18 (07/07 1000) BP: (86-128)/(54-111) 91/78 mmHg (07/07 1000) SpO2:  [86 %-99 %] 99 % (07/07 1000)  Weight change:  Filed Weights   01/30/15 1128  Weight: 67.132 kg (148 lb)    Intake/Output: I/O last 3 completed shifts: In: 4608.3 [I.V.:3708.3; IV Piggyback:900] Out: 850 [Urine:850]   Intake/Output this shift:  Total I/O In: 103 [I.V.:3; IV Piggyback:100] Out: -   Physical Exam: General: Critically ill  Head: Normocephalic, atraumatic. Moist oral mucosal membranes  Eyes: +icterus  Neck: Supple, trachea midline  Lungs:  Clear to auscultation normal effort  Heart: Regular rate and rhythm  Abdomen:  Soft, nontender, +ascites  Extremities:  ++ peripheral edema.  Neurologic: Awake, alert, will follow simple commands  Skin: +jaundice       Basic Metabolic Panel:  Recent Labs Lab 01/30/15 1155 01/31/15 0406 02/01/15 0437 02/02/15 0650  NA 128* 129* 131* 133*  K 4.8 5.5* 4.6 3.9  CL 93* 99* 100* 102  CO2 23 21* 21* 24  GLUCOSE 133* 115* 112* 96  BUN 24* 26* 28* 21*  CREATININE 2.98* NOT VALID SEE COMMENTS UNABLE TO CALCULATE DUE TO ICTERUS  CALCIUM 8.4* 7.9* 8.2* 8.7*    Liver Function Tests:  Recent Labs Lab 01/30/15 1155 01/31/15 0406 02/01/15 0437 02/02/15 0650  AST 395* 287* 175* 131*  ALT 202* 171* 135* 106*  ALKPHOS 175* 149* 132* 114  BILITOT 26.6* 24.0* 22.4* 22.5*  PROT 6.7 6.4* 6.0* 6.5  ALBUMIN 2.3* 2.1* 2.3* 3.3*    Recent Labs Lab 01/30/15 1155  LIPASE 34    Recent Labs Lab 01/30/15 1240 02/01/15 1602  AMMONIA 109* 91*    CBC:  Recent Labs Lab 01/30/15 1155 01/31/15 0406 02/01/15 0437 02/02/15 0650  WBC 23.3*  21.7* 20.3* 13.9*  NEUTROABS 17.1*  --  16.5* 10.8*  HGB 11.3* 11.0* 10.4* 10.3*  HCT 33.0* 32.3* 30.5* 30.0*  MCV 101.5* 101.0* 101.8* 101.7*  PLT 180 183 154 130*    Cardiac Enzymes:  Recent Labs Lab 01/30/15 1155 01/30/15 1728 01/31/15 0406  TROPONINI 0.07* 0.07* <0.03    BNP: Invalid input(s): POCBNP  CBG: No results for input(s): GLUCAP in the last 168 hours.  Microbiology: Results for orders placed or performed during the hospital encounter of 01/30/15  Urine culture     Status: None   Collection Time: 01/30/15 12:41 PM  Result Value Ref Range Status   Specimen Description URINE, CATHETERIZED  Final   Special Requests Normal  Final   Culture NO GROWTH 1 DAY  Final   Report Status 02/01/2015 FINAL  Final  Blood culture (routine x 2)     Status: None (Preliminary result)   Collection Time: 01/30/15  1:18 PM  Result Value Ref Range Status   Specimen Description RIGHT ANTECUBITAL  Final   Special Requests   Final    BOTTLES DRAWN AEROBIC AND ANAEROBIC  5 CC AEROBIC, .5 CC ANAEROBIC   Culture NO GROWTH < 24 HOURS  Final   Report Status PENDING  Incomplete  Blood culture (routine x 2)     Status: None (Preliminary result)   Collection Time: 01/30/15  1:20  PM  Result Value Ref Range Status   Specimen Description 4 BLOOD RIGHT  Final   Special Requests   Final    BOTTLES DRAWN AEROBIC AND ANAEROBIC  .5 CC AEROBIC, 2.5 CC ANAEROBIC   Culture NO GROWTH < 24 HOURS  Final   Report Status PENDING  Incomplete  MRSA PCR Screening     Status: None   Collection Time: 01/31/15 12:48 PM  Result Value Ref Range Status   MRSA by PCR NEGATIVE NEGATIVE Final    Comment:        The GeneXpert MRSA Assay (FDA approved for NASAL specimens only), is one component of a comprehensive MRSA colonization surveillance program. It is not intended to diagnose MRSA infection nor to guide or monitor treatment for MRSA infections.     Coagulation Studies:  Recent Labs   01/30/15 1600 01/31/15 0406 02/01/15 0437  LABPROT 26.2* 26.0* 25.6*  INR 2.39 2.37 2.32    Urinalysis:  Recent Labs  01/30/15 1241  COLORURINE AMBER*  LABSPEC 1.019  PHURINE 5.0  GLUCOSEU 50*  HGBUR 1+*  BILIRUBINUR 2+*  KETONESUR NEGATIVE  PROTEINUR 30*  NITRITE NEGATIVE  LEUKOCYTESUR NEGATIVE      Imaging: No results found.   Medications:   . sodium chloride 100 mL/hr at 02/01/15 1741  . norepinephrine     . albumin human  25 g Intravenous Q8H  . feeding supplement (RESOURCE BREEZE)  1 Container Oral TID WC  . folic acid  1 mg Oral Daily  . lactulose  30 g Oral TID  . levETIRAcetam  500 mg Oral BID  . multivitamin with minerals  1 tablet Oral Daily  . pantoprazole (PROTONIX) IV  40 mg Intravenous Q12H  . piperacillin-tazobactam  3.375 g Intravenous 3 times per day  . predniSONE  40 mg Oral Q breakfast  . QUEtiapine Fumarate  150 mg Oral QHS  . sodium chloride  3 mL Intravenous Q12H  . thiamine  100 mg Oral Daily  . vancomycin  1,000 mg Intravenous Q36H   albuterol, ipratropium-albuterol, LORazepam, morphine injection, ondansetron **OR** ondansetron (ZOFRAN) IV, oxyCODONE  Assessment/ Plan:  [name] is a 44 y.o. white female with cirrhosis of the liver, cerebral aneurysm, pancreatitis, bipolar disorder, seizure disorder, COPD, GERD, history of suicide attempt, who [facility]    1. Acute renal failure unspecified with hyperkalemia: Concerned for prerenal azotemia versus acute hepatorenal syndrome. Urine output improved with NS and IV albumin.  -Pt seen at bedside, renal function about the same, BUN down, Cr unable to be calculated.  UOP acceptable.  No urgent indication for HD at this time.  Continue IVFs and albumin.  Will follow.   2. Hyponatremia: most likely due to end stage liver disease with cirrhosis.  - Na up to 133, continue 0.9 NS.  3. Anemia unspecified D64.9: hgb stable at 10.3, no indication for procrit.   LOS: 3 Josef Tourigny 7/7/201610:37 AM

## 2015-02-03 LAB — COMPREHENSIVE METABOLIC PANEL
ALK PHOS: 99 U/L (ref 38–126)
ALT: 86 U/L — ABNORMAL HIGH (ref 14–54)
ANION GAP: 7 (ref 5–15)
AST: 111 U/L — ABNORMAL HIGH (ref 15–41)
Albumin: 3.5 g/dL (ref 3.5–5.0)
BUN: 20 mg/dL (ref 6–20)
CALCIUM: 9 mg/dL (ref 8.9–10.3)
CO2: 25 mmol/L (ref 22–32)
Chloride: 103 mmol/L (ref 101–111)
Creatinine, Ser: 0.41 mg/dL — ABNORMAL LOW (ref 0.44–1.00)
GFR calc non Af Amer: >60 mL/min (ref >60)
GLUCOSE: 162 mg/dL — AB (ref 65–99)
POTASSIUM: 4.5 mmol/L (ref 3.5–5.1)
SODIUM: 135 mmol/L (ref 135–145)
TOTAL PROTEIN: 6.3 g/dL — AB (ref 6.5–8.1)
Total Bilirubin: 21.3 mg/dL (ref 0.3–1.2)

## 2015-02-03 LAB — CBC WITH DIFFERENTIAL/PLATELET
BASOS PCT: 0 %
Basophils Absolute: 0 10*3/uL (ref 0–0.1)
EOS ABS: 0 10*3/uL (ref 0–0.7)
Eosinophils Relative: 0 %
HCT: 30.1 % — ABNORMAL LOW (ref 35.0–47.0)
Hemoglobin: 10.2 g/dL — ABNORMAL LOW (ref 12.0–16.0)
Lymphocytes Relative: 10 %
Lymphs Abs: 0.9 10*3/uL — ABNORMAL LOW (ref 1.0–3.6)
MCH: 34.6 pg — AB (ref 26.0–34.0)
MCHC: 34 g/dL (ref 32.0–36.0)
MCV: 101.9 fL — ABNORMAL HIGH (ref 80.0–100.0)
MONOS PCT: 11 %
Monocytes Absolute: 1 10*3/uL — ABNORMAL HIGH (ref 0.2–0.9)
NEUTROS PCT: 79 %
Neutro Abs: 6.9 10*3/uL — ABNORMAL HIGH (ref 1.4–6.5)
Platelets: 111 10*3/uL — ABNORMAL LOW (ref 150–440)
RBC: 2.95 MIL/uL — ABNORMAL LOW (ref 3.80–5.20)
RDW: 15.5 % — AB (ref 11.5–14.5)
WBC: 8.8 10*3/uL (ref 3.6–11.0)

## 2015-02-03 LAB — GLUCOSE, CAPILLARY: Glucose-Capillary: 139 mg/dL — ABNORMAL HIGH (ref 65–99)

## 2015-02-03 MED ORDER — VANCOMYCIN HCL IN DEXTROSE 1-5 GM/200ML-% IV SOLN
1000.0000 mg | Freq: Two times a day (BID) | INTRAVENOUS | Status: DC
  Administered 2015-02-03 – 2015-02-05 (×5): 1000 mg via INTRAVENOUS
  Filled 2015-02-03 (×8): qty 200

## 2015-02-03 NOTE — Progress Notes (Signed)
[facility]  ROUNDING NOTE   Subjective:  Pt actually improved today. Cr down to 0.41. Good UOP of 1.2 liters noted.  In pain this AM.   Objective:  Vital signs in last 24 hours:  Temp:  [97.6 F (36.4 C)-98 F (36.7 C)] 97.6 F (36.4 C) (07/08 0700) Pulse Rate:  [50-106] 50 (07/08 0700) Resp:  [12-30] 12 (07/08 0700) BP: (85-114)/(51-83) 102/82 mmHg (07/08 0700) SpO2:  [91 %-99 %] 93 % (07/08 0700)  Weight change:  Filed Weights   01/30/15 1128  Weight: 67.132 kg (148 lb)    Intake/Output: I/O last 3 completed shifts: In: 4726.4 [I.V.:3876.4; IV Piggyback:850] Out: 1225 [Urine:1225]   Intake/Output this shift:  Total I/O In: -  Out: 20 [Urine:20]  Physical Exam: General: In pain  Head: Normocephalic, atraumatic. Moist oral mucosal membranes  Eyes: +icterus  Neck: Supple, trachea midline  Lungs:  Clear to auscultation normal effort  Heart: Regular rate and rhythm  Abdomen:  Soft, nontender, +ascites  Extremities:  ++ peripheral edema.  Neurologic: Awake, alert, follows simple commands  Skin: +jaundice       Basic Metabolic Panel:  Recent Labs Lab 01/30/15 1155 01/31/15 0406 02/01/15 0437 02/02/15 0650 02/03/15 0603  NA 128* 129* 131* 133* 135  K 4.8 5.5* 4.6 3.9 4.5  CL 93* 99* 100* 102 103  CO2 23 21* 21* 24 25  GLUCOSE 133* 115* 112* 96 162*  BUN 24* 26* 28* 21* 20  CREATININE 2.98* NOT VALID SEE COMMENTS UNABLE TO CALCULATE DUE TO ICTERUS 0.41*  CALCIUM 8.4* 7.9* 8.2* 8.7* 9.0    Liver Function Tests:  Recent Labs Lab 01/30/15 1155 01/31/15 0406 02/01/15 0437 02/02/15 0650 02/03/15 0603  AST 395* 287* 175* 131* 111*  ALT 202* 171* 135* 106* 86*  ALKPHOS 175* 149* 132* 114 99  BILITOT 26.6* 24.0* 22.4* 22.5* 21.3*  PROT 6.7 6.4* 6.0* 6.5 6.3*  ALBUMIN 2.3* 2.1* 2.3* 3.3* 3.5    Recent Labs Lab 01/30/15 1155  LIPASE 34    Recent Labs Lab 01/30/15 1240 02/01/15 1602  AMMONIA 109* 91*    CBC:  Recent  Labs Lab 01/30/15 1155 01/31/15 0406 02/01/15 0437 02/02/15 0650 02/03/15 0603  WBC 23.3* 21.7* 20.3* 13.9* 8.8  NEUTROABS 17.1*  --  16.5* 10.8* 6.9*  HGB 11.3* 11.0* 10.4* 10.3* 10.2*  HCT 33.0* 32.3* 30.5* 30.0* 30.1*  MCV 101.5* 101.0* 101.8* 101.7* 101.9*  PLT 180 183 154 130* 111*    Cardiac Enzymes:  Recent Labs Lab 01/30/15 1155 01/30/15 1728 01/31/15 0406  TROPONINI 0.07* 0.07* <0.03    BNP: Invalid input(s): POCBNP  CBG: No results for input(s): GLUCAP in the last 168 hours.  Microbiology: Results for orders placed or performed during the hospital encounter of 01/30/15  Urine culture     Status: None   Collection Time: 01/30/15 12:41 PM  Result Value Ref Range Status   Specimen Description URINE, CATHETERIZED  Final   Special Requests Normal  Final   Culture NO GROWTH 1 DAY  Final   Report Status 02/01/2015 FINAL  Final  Blood culture (routine x 2)     Status: None (Preliminary result)   Collection Time: 01/30/15  1:18 PM  Result Value Ref Range Status   Specimen Description RIGHT ANTECUBITAL  Final   Special Requests   Final    BOTTLES DRAWN AEROBIC AND ANAEROBIC  5 CC AEROBIC, .5 CC ANAEROBIC   Culture NO GROWTH 4 DAYS  Final  Report Status PENDING  Incomplete  Blood culture (routine x 2)     Status: None (Preliminary result)   Collection Time: 01/30/15  1:20 PM  Result Value Ref Range Status   Specimen Description 4 BLOOD RIGHT  Final   Special Requests   Final    BOTTLES DRAWN AEROBIC AND ANAEROBIC  .5 CC AEROBIC, 2.5 CC ANAEROBIC   Culture NO GROWTH 4 DAYS  Final   Report Status PENDING  Incomplete  MRSA PCR Screening     Status: None   Collection Time: 01/31/15 12:48 PM  Result Value Ref Range Status   MRSA by PCR NEGATIVE NEGATIVE Final    Comment:        The GeneXpert MRSA Assay (FDA approved for NASAL specimens only), is one component of a comprehensive MRSA colonization surveillance program. It is not intended to diagnose  MRSA infection nor to guide or monitor treatment for MRSA infections.     Coagulation Studies:  Recent Labs  02/01/15 0437  LABPROT 25.6*  INR 2.32    Urinalysis: No results for input(s): COLORURINE, LABSPEC, PHURINE, GLUCOSEU, HGBUR, BILIRUBINUR, KETONESUR, PROTEINUR, UROBILINOGEN, NITRITE, LEUKOCYTESUR in the last 72 hours.  Invalid input(s): APPERANCEUR    Imaging: Dg Chest Port 1 View  02/02/2015   CLINICAL DATA:  Central line placement.  Hepatic encephalopathy.  EXAM: PORTABLE CHEST - 1 VIEW  COMPARISON:  01/30/2015  FINDINGS: Central line tip is in the superior vena cava. No pneumothorax. Slight bibasilar atelectasis, new on the left and increased on the right. Heart size and pulmonary vascularity are normal.  IMPRESSION: Central line in good position. Increased atelectasis at the lung bases.   Electronically Signed   By: Francene BoyersJames  Maxwell M.D.   On: 02/02/2015 19:59     Medications:   . sodium chloride 100 mL/hr at 02/02/15 1219  . dexmedetomidine 0.5 mcg/kg/hr (02/03/15 0742)  . norepinephrine     . albumin human  25 g Intravenous Q8H  . feeding supplement (RESOURCE BREEZE)  1 Container Oral TID WC  . folic acid  1 mg Oral Daily  . lactulose  30 g Oral TID  . levETIRAcetam  500 mg Oral BID  . multivitamin with minerals  1 tablet Oral Daily  . pantoprazole (PROTONIX) IV  40 mg Intravenous Q12H  . piperacillin-tazobactam  3.375 g Intravenous 3 times per day  . predniSONE  40 mg Oral Q breakfast  . QUEtiapine Fumarate  150 mg Oral QHS  . sodium chloride  3 mL Intravenous Q12H  . thiamine  100 mg Oral Daily  . vancomycin  1,000 mg Intravenous Q36H   albuterol, ipratropium-albuterol, LORazepam, morphine injection, ondansetron **OR** ondansetron (ZOFRAN) IV, oxyCODONE  Assessment/ Plan:  Crystal [name] is a 44 y.o. white female with cirrhosis of the liver, cerebral aneurysm, pancreatitis, bipolar disorder, seizure disorder, COPD, GERD, history of suicide attempt,  who was [facility] to [facility] on 01/30/2015    1. Acute renal failure unspecified with hyperkalemia: Concerned for prerenal azotemia versus acute hepatorenal syndrome. Urine output improved with NS and IV albumin.  -Renal status significantly improved, Cr down to 0.41.  BUN also down, good UOP of 1.2 liters.  Will now stop albumin infusion.  Will continue NS infusion for now but will decrease rate to 75cc/hr.  2. Hyponatremia: most likely due to end stage liver disease with cirrhosis.  - Na up to 135, continue 0.9 NS for at least one additional day.  3. Anemia unspecified D64.9: hgb 10.2 and  stable.   LOS: 4 Avondre Richens 7/8/20168:37 AM

## 2015-02-03 NOTE — Progress Notes (Signed)
PROGRESS NOTE  Crystal [name] ZOX:[id number] DOB: [birth year] DOA: 01/30/2015 PCP: Barbette Reichmann, MD  Subjective:  44 y/o F with hx of Alcohol abuse, Bipolar disorder, Seizures from alcohol withdrawal,Anxiety, ADHD admitted with abdominal pain and altered mental status with increased lethargy consistent with Hepatic encephalopathy. Was anxious and agitated yesterday  This am  is sedated from Precedex  Consultants: GI- Dr. Ricki Rodriguez   Objective: BP 102/82 mmHg  Pulse 50  Temp(Src) 97.6 F (36.4 C) (Oral)  Resp 12  Ht  (1.6 m)  Wt 67.132 kg (148 lb)  BMI 26.22 kg/m2  SpO2 93%  LMP   Intake/Output Summary (Last 24 hours) at 02/03/15 0830 Last data filed at 02/03/15 0732  Gross per 24 hour  Intake 3194.77 ml  Output   1245 ml  Net 1949.77 ml   Filed Weights   01/30/15 1128  Weight: 67.132 kg (148 lb)    Exam:   General: Jaundiced. Sedated  Spider nevi and peteciae noted   Cardiovascular: S1 S2   Respiratory: Clear to auscultation  Abdomen: distended/ Ascites +  Neuro:Speech is slurred and incoherent   Data Reviewed: Basic Metabolic Panel:  Recent Labs Lab 01/30/15 1155 01/31/15 0406 02/01/15 0437 02/02/15 0650 02/03/15 0603  NA 128* 129* 131* 133* 135  K 4.8 5.5* 4.6 3.9 4.5  CL 93* 99* 100* 102 103  CO2 23 21* 21* 24 25  GLUCOSE 133* 115* 112* 96 162*  BUN 24* 26* 28* 21* 20  CREATININE 2.98* NOT VALID SEE COMMENTS UNABLE TO CALCULATE DUE TO ICTERUS 0.41*  CALCIUM 8.4* 7.9* 8.2* 8.7* 9.0   Liver Function Tests:  Recent Labs Lab 01/30/15 1155 01/31/15 0406 02/01/15 0437 02/02/15 0650 02/03/15 0603  AST 395* 287* 175* 131* 111*  ALT 202* 171* 135* 106* 86*  ALKPHOS 175* 149* 132* 114 99  BILITOT 26.6* 24.0* 22.4* 22.5* 21.3*  PROT 6.7 6.4* 6.0* 6.5 6.3*  ALBUMIN 2.3* 2.1* 2.3* 3.3* 3.5    Recent Labs Lab 01/30/15 1155  LIPASE 34    Recent Labs Lab 01/30/15 1240 02/01/15 1602  AMMONIA 109* 91*   CBC:  Recent  Labs Lab 01/30/15 1155 01/31/15 0406 02/01/15 0437 02/02/15 0650 02/03/15 0603  WBC 23.3* 21.7* 20.3* 13.9* 8.8  NEUTROABS 17.1*  --  16.5* 10.8* 6.9*  HGB 11.3* 11.0* 10.4* 10.3* 10.2*  HCT 33.0* 32.3* 30.5* 30.0* 30.1*  MCV 101.5* 101.0* 101.8* 101.7* 101.9*  PLT 180 183 154 130* 111*   Cardiac Enzymes:    Recent Labs Lab 01/30/15 1155 01/30/15 1728 01/31/15 0406  TROPONINI 0.07* 0.07* <0.03   BNP (last 3 results) No results for input(s): BNP in the last 8760 hours.  ProBNP (last 3 results) No results for input(s): PROBNP in the last 8760 hours.  CBG: No results for input(s): GLUCAP in the last 168 hours.  Recent Results (from the past 240 hour(s))  Urine culture     Status: None   Collection Time: 01/30/15 12:41 PM  Result Value Ref Range Status   Specimen Description URINE, CATHETERIZED  Final   Special Requests Normal  Final   Culture NO GROWTH 1 DAY  Final   Report Status 02/01/2015 FINAL  Final  Blood culture (routine x 2)     Status: None (Preliminary result)   Collection Time: 01/30/15  1:18 PM  Result Value Ref Range Status   Specimen Description RIGHT ANTECUBITAL  Final   Special Requests   Final    BOTTLES DRAWN AEROBIC AND  ANAEROBIC  5 CC AEROBIC, .5 CC ANAEROBIC   Culture NO GROWTH 4 DAYS  Final   Report Status PENDING  Incomplete  Blood culture (routine x 2)     Status: None (Preliminary result)   Collection Time: 01/30/15  1:20 PM  Result Value Ref Range Status   Specimen Description 4 BLOOD RIGHT  Final   Special Requests   Final    BOTTLES DRAWN AEROBIC AND ANAEROBIC  .5 CC AEROBIC, 2.5 CC ANAEROBIC   Culture NO GROWTH 4 DAYS  Final   Report Status PENDING  Incomplete  MRSA PCR Screening     Status: None   Collection Time: 01/31/15 12:48 PM  Result Value Ref Range Status   MRSA by PCR NEGATIVE NEGATIVE Final    Comment:        The GeneXpert MRSA Assay (FDA approved for NASAL specimens only), is one component of a comprehensive MRSA  colonization surveillance program. It is not intended to diagnose MRSA infection nor to guide or monitor treatment for MRSA infections.      Studies: Dg Chest Port 1 View  02/02/2015   CLINICAL DATA:  Central line placement.  Hepatic encephalopathy.  EXAM: PORTABLE CHEST - 1 VIEW  COMPARISON:  01/30/2015  FINDINGS: Central line tip is in the superior vena cava. No pneumothorax. Slight bibasilar atelectasis, new on the left and increased on the right. Heart size and pulmonary vascularity are normal.  IMPRESSION: Central line in good position. Increased atelectasis at the lung bases.   Electronically Signed   By: Francene BoyersJames  Maxwell M.D.   On: 02/02/2015 19:59    Scheduled Meds: . albumin human  25 g Intravenous Q8H  . feeding supplement (RESOURCE BREEZE)  1 Container Oral TID WC  . folic acid  1 mg Oral Daily  . lactulose  30 g Oral TID  . levETIRAcetam  500 mg Oral BID  . multivitamin with minerals  1 tablet Oral Daily  . pantoprazole (PROTONIX) IV  40 mg Intravenous Q12H  . piperacillin-tazobactam  3.375 g Intravenous 3 times per day  . predniSONE  40 mg Oral Q breakfast  . QUEtiapine Fumarate  150 mg Oral QHS  . sodium chloride  3 mL Intravenous Q12H  . thiamine  100 mg Oral Daily  . vancomycin  1,000 mg Intravenous Q36H    Continuous Infusions: . sodium chloride 100 mL/hr at 02/02/15 1219  . dexmedetomidine 0.5 mcg/kg/hr (02/03/15 0742)  . norepinephrine      Assessment/Plan:  1 Altered mental status secondary to acute Hepatic encephalopathy and Cirrhosis of liver   -On lactulose and Prednisone  LFT's improving 2 Anxiety and agitation secondary to DT's: On Precedex- Try to wean off today 3 Leukocytosis: Possible sepsis- Improved-- On Zosyn and Vanc  4 Elevated Troponin secondary to Demand ischemia  5 Seizure disorder: On Keppra  6 Acute Renal failure/ Hypotension and Metabolic acidosis  with possible  Hepatorenal syndrome Continue IV hydration 7 Hyponatremia; Slowly  improving- Continue to monitor     Code Status: Full  Family Communication: neice     Jimi Schappert   02/03/2015, 8:30 AM  LOS: 4 days

## 2015-02-03 NOTE — Progress Notes (Signed)
Patient agitated and confused.  heartrate 112, bp 200's systolic.  precedex drip restarted.  Prn ativan given.  Morphine for pain.  Dr Dareen PianoAnderson notified of changes in vital signs and agitation.  Continue to monitor. No new orders

## 2015-02-03 NOTE — Consult Note (Deleted)
Palliative Medicine Inpatient Consult Follow Up Note   Name: Aram CandelaSonya R Streety Date: 02/03/2015 MRN: [id number]  DOB: [birth year]  Referring Physician: Barbette ReichmannVishwanath Hande, MD  Palliative Care consult requested for this 44 y.o. 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   CODE STATUS: Full code --have discussed and husband wants to run it by pts mother first before making her DNR   PAST MEDICAL HISTORY: Past Medical History  Diagnosis Date  . Cirrhosis   . Brain aneurysm   . Pancreatitis   . Bipolar 1 disorder   . ADHD (attention deficit hyperactivity disorder)   . Anxiety   . Seizures   . Suicide attempt   . COPD (chronic obstructive pulmonary disease)   . GERD (gastroesophageal reflux disease)     PAST SURGICAL HISTORY:  Past Surgical History  Procedure Laterality Date  . Brain surgery    . Laproscopy      Vital Signs: BP 99/72 mmHg  Pulse 69  Temp(Src) 97.8 F (36.6 C) (Oral)  Resp 19  Ht 5\' 3"  (1.6 m)  Wt 67.132 kg (148 lb)  BMI 26.22 kg/m2  SpO2 93%  LMP  Filed Weights   01/30/15 1128  Weight: 67.132 kg (148 lb)    Estimated body mass index is 26.22 kg/(m^2) as calculated from the following:   Height as of this encounter: 5\' 3"  (1.6 m).   Weight as of this encounter: 67.132 kg (148 lb).  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 Precedex.   Seizure Disorder  Noncompliance with medical regimen at home (per report)        PLAN: I was to talk again with pts husband about code status, but he is not here as yet.  Perhaps he will show up ([place] have taken place this late morning).  I  Had mentioned that she likely has LESS THAN 6 MONTHS TO LIVE so if she does poorly, but survives, Hospice would be a consideration at time of discharge.  For now, I can only be of assistance to the degree that family permits and since the husband is not here, I cannot discuss code status again today.    There will be NO Palliative Care Consults for the next two weeks.   However, if Hospice is elected, that certainly can be facilitated by the case mgr.      Time Spent:  40 minutes.

## 2015-02-03 NOTE — Consult Note (Addendum)
Palliative Medicine Inpatient Consult Follow Up Note   Name: Crystal [name] Date: 02/03/2015 MRN: [id number]  DOB: 1971/04/22  Referring Physician: Barbette Reichmann, MD  Palliative Care consult requested for this 44 y.o. female for goals of medical therapy in patient with liver disease.   I am to meet with husband to discuss code status further  --but he is not here yet.  Pt has a niece who wants to make sure that if she survives, she is not where she has alcohol brought to her, but the husband is the one who is making decisions.  I spoke with Dr Marcello Fennel today about patient and my talk with husband yesterday.    CODE STATUS: Full code --have discussed and husband wants to run it by pts mother first before making her DNR.  Then he was to get with me when he came by this afternoon.  He is not here (yet).    PAST MEDICAL HISTORY: Past Medical History  Diagnosis Date  . Cirrhosis   . Brain aneurysm   . Pancreatitis   . Bipolar 1 disorder   . ADHD (attention deficit hyperactivity disorder)   . Anxiety   . Seizures   . Suicide attempt   . COPD (chronic obstructive pulmonary disease)   . GERD (gastroesophageal reflux disease)     PAST SURGICAL HISTORY:  Past Surgical History  Procedure Laterality Date  . Brain surgery    . Laproscopy      Vital Signs: BP 99/72 mmHg  Pulse 69  Temp(Src) 97.8 F (36.6 C) (Oral)  Resp 19  Ht  (1.6 m)  Wt 67.132 kg (148 lb)  BMI 26.22 kg/m2  SpO2 93%  LMP  Filed Weights   01/30/15 1128  Weight: 67.132 kg (148 lb)    Estimated body mass index is 26.22 kg/(m^2) as calculated from the following:   Height as of this encounter:  (1.6 m).   Weight as of this encounter: 67.132 kg (148 lb).  PHYSICAL EXAM: Agitated at first  Temporal Wasting noted OP clear --no blooding Neck no JVD or thyromegaly Hrt rrr no mgr Lungs ronchi heard Abd soft and mildly distended Neuro --floridly confused but w/o motor weakness or facial assymetry  noted Skin --very jaundiced    IMPRESSION: Acute on Chronic Alcoholic Liver Disease and Failure --survival is possible but she could be nearing end of life given severity of disease  --she is on prednisone --seems to be slightly more alert, but not taking lactulose and not having BMs so might need NG for Lactulose administration--but defer to attending (Dr Marcello Fennel)  COPD --stable  Alcoholism  Bipolar Disorder  Agitation --sedation required. Precedex stopped  Seizure Disorder  Noncompliance with medical regimen at home (per report)        PLAN: I was to talk again with pts husband about code status, but he is not here as yet.  Perhaps he will show up (he is at a job interview in [place] that was to have taken place this late morning).  I  Had mentioned that she likely has LESS THAN 6 MONTHS TO LIVE so if she does poorly, but survives, Hospice would be a consideration at time of discharge.  For now, I can only be of assistance to the degree that family permits and since the husband is not here, I cannot discuss code status again today.    There will be NO Palliative Care Consults for the next two weeks.   However, if  Hospice is elected, that certainly can be facilitated by the case mgr.      Time Spent:  50 minutes.    NOTE :  PATIENT'S MOTHER, NIECE AND NIECE'S HUSBAND CAME.  I SPOKE WITH THE MOTHER ABOUT CODE STATUS.  SHE AGREES WITH DNR.  Husband came in --he is informed.  DNR order is placed.  Continue aggressive care otherwise but DNR is in place.

## 2015-02-03 NOTE — Progress Notes (Signed)
Nutrition Follow-up     INTERVENTION: Coordination of Care: discussed possibility of NG/dobhoff placement for meds and nutrition during ICU rounds with MD Christene SlatesKasa, Christina RN. MD thinks pt more alert today, starting to come around. MD wanting to hold off on placement of NG/dobhoff and reassess in next day or so. Continue to assess Medical Food Supplement Therapy: continue Boost Breeze TID with meals while on CL Meals/Snacks: recommend advancement of diet as medically able  NUTRITION DIAGNOSIS:  Inadequate oral intake related to altered GI function, acute illness as evidenced by per patient/family report. Continues   GOAL:  Diet advancement within 2-3 days  MONITOR:   (Energy Intake, Digestive system, Anthropometrics, Hepatic Profile, Electrolyte and renal Profile)   ASSESSMENT:  Pt remains confused, although more alert. On precedex. Not taking po meds or food. Pt with ARF with hyperkalemia (improved)  Diet Order: CL, NPO/CL day 4  Current Nutrition: not able to take po at present  Gastrointestinal Profile: +loose BM 7/7   Medications: precedex, NS at 100 ml/hr, lactulose ordered but pt unable to take oral meds at present  Electrolyte/Renal Profile and Glucose Profile:   Recent Labs Lab 02/01/15 0437 02/02/15 0650 02/03/15 0603  NA 131* 133* 135  K 4.6 3.9 4.5  CL 100* 102 103  CO2 21* 24 25  BUN 28* 21* 20  CREATININE SEE COMMENTS UNABLE TO CALCULATE DUE TO ICTERUS 0.41*  CALCIUM 8.2* 8.7* 9.0  GLUCOSE 112* 96 162*   Protein Profile:  Recent Labs Lab 02/01/15 0437 02/02/15 0650 02/03/15 0603  ALBUMIN 2.3* 3.3* 3.5     Gastrointestinal Profile: ammonia 91    Weight Trend since Admission: Filed Weights   01/30/15 1128  Weight: 148 lb (67.132 kg)    Height:  Ht Readings from Last 1 Encounters:  01/30/15 5\' 3"  (1.6 m)    Weight:  Wt Readings from Last 1 Encounters:  01/30/15 148 lb (67.132 kg)    Ideal Body Weight:     Wt Readings from  Last 10 Encounters:  01/30/15 148 lb (67.132 kg)    BMI:  Body mass index is 26.22 kg/(m^2).  Estimated Nutritional Needs:  Kcal:  1722-2034kcals, BEE: 1304kcal, TEE: (IF 1.1-1.3)(AF 1.2)   Protein:  67-80g protein (1.0-1.2g/kg)  Fluid:  1675-206410mL of fluid (25-3830mL/kg)  Diet Order:  Diet clear liquid Room service appropriate?: Yes; Fluid consistency:: Thin  EDUCATION NEEDS:  Education needs no appropriate at this time   Intake/Output Summary (Last 24 hours) at 02/03/15 1210 Last data filed at 02/03/15 0732  Gross per 24 hour  Intake 3191.77 ml  Output   1245 ml  Net 1946.77 ml    HIGH Care Level  Romelle Starcherate Kj Imbert MS, RD, LDN [phone] Pager

## 2015-02-03 NOTE — Progress Notes (Signed)
ANTIBIOTIC CONSULT NOTE - FOLLOW UP  Pharmacy Consult for Vancomycin Indication: sepsis  Allergies  Allergen Reactions  . Oxycontin [Oxycodone Hcl] Itching  . Codeine Itching    Other reaction(s): Unknown    Patient Measurements: Height:  (160 cm) Weight: 148 lb (67.132 kg) IBW/kg (Calculated) : 52.4   Vital Signs: Temp: 97.6 F (36.4 C) (07/08 0700) Temp Source: Oral (07/08 0700) BP: 102/82 mmHg (07/08 0700) Pulse Rate: 50 (07/08 0700) Intake/Output from previous day: 07/07 0701 - 07/08 0700 In: 3294.8 [I.V.:2644.8; IV Piggyback:650] Out: 1225 [Urine:1225] Intake/Output from this shift: Total I/O In: -  Out: 20 [Urine:20]  Labs:  Recent Labs  02/01/15 0437 02/02/15 0650 02/03/15 0603  WBC 20.3* 13.9* 8.8  HGB 10.4* 10.3* 10.2*  PLT 154 130* 111*  CREATININE SEE COMMENTS UNABLE TO CALCULATE DUE TO ICTERUS 0.41*   Estimated Creatinine Clearance: 85.2 mL/min (by C-G formula based on Cr of 0.41).  Recent Labs  02/01/15 0437  [facility] 7     Microbiology: Recent Results (from the past 720 hour(s))  Urine culture     Status: None   Collection Time: 01/30/15 12:41 PM  Result Value Ref Range Status   Specimen Description URINE, CATHETERIZED  Final   Special Requests Normal  Final   Culture NO GROWTH 1 DAY  Final   Report Status 02/01/2015 FINAL  Final  Blood culture (routine x 2)     Status: None (Preliminary result)   Collection Time: 01/30/15  1:18 PM  Result Value Ref Range Status   Specimen Description RIGHT ANTECUBITAL  Final   Special Requests   Final    BOTTLES DRAWN AEROBIC AND ANAEROBIC  5 CC AEROBIC, .5 CC ANAEROBIC   Culture NO GROWTH 4 DAYS  Final   Report Status PENDING  Incomplete  Blood culture (routine x 2)     Status: None (Preliminary result)   Collection Time: 01/30/15  1:20 PM  Result Value Ref Range Status   Specimen Description 4 BLOOD RIGHT  Final   Special Requests   Final    BOTTLES DRAWN AEROBIC AND ANAEROBIC  .5 CC  AEROBIC, 2.5 CC ANAEROBIC   Culture NO GROWTH 4 DAYS  Final   Report Status PENDING  Incomplete  MRSA PCR Screening     Status: None   Collection Time: 01/31/15 12:48 PM  Result Value Ref Range Status   MRSA by PCR NEGATIVE NEGATIVE Final    Comment:        The GeneXpert MRSA Assay (FDA approved for NASAL specimens only), is one component of a comprehensive MRSA colonization surveillance program. It is not intended to diagnose MRSA infection nor to guide or monitor treatment for MRSA infections.     Anti-infectives    Start     Dose/Rate Route Frequency Ordered Stop   02/03/15 0900  vancomycin (VANCOCIN) IVPB 1000 mg/200 mL premix     1,000 mg 200 mL/hr over 60 Minutes Intravenous Every 12 hours 02/03/15 0854     02/01/15 0600  vancomycin (VANCOCIN) IVPB 1000 mg/200 mL premix  Status:  Discontinued     1,000 mg 200 mL/hr over 60 Minutes Intravenous Every 36 hours 01/30/15 2002 02/03/15 0854   01/30/15 2200  piperacillin-tazobactam (ZOSYN) IVPB 3.375 g     3.375 g 12.5 mL/hr over 240 Minutes Intravenous 3 times per day 01/30/15 1738     01/30/15 1630  piperacillin-tazobactam (ZOSYN) IVPB 3.375 g  Status:  Discontinued     3.375 g 100  mL/hr over 30 Minutes Intravenous 3 times per day 01/30/15 1618 01/30/15 1738   01/30/15 1515  vancomycin (VANCOCIN) IVPB 1000 mg/200 mL premix     1,000 mg 200 mL/hr over 60 Minutes Intravenous  Once 01/30/15 1500 01/30/15 1647   01/30/15 1445  piperacillin-tazobactam (ZOSYN) IVPB 3.375 g     3.375 g 12.5 mL/hr over 240 Minutes Intravenous  Once 01/30/15 1436 01/30/15 1547      Assessment: 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  Patient renal function has improved significantly. Currently the serum creatinine is 0.41 mg/dl rounded to 0.8 for antimicrobial dosing and kinetics  PK parameters: Kel (hr-1): 0.068 Half-life (hrs): 10.19 Vd (liters): 46.97 (factor used: 0.7  L/kg)   Goal of Therapy:  Vancomycin trough level 15-20 mcg/ml  Plan:  Patient previously on Vancomycin 1 g IV q36 hours last dose @ ~2000 on 7/7. Will start Vancomycin 1 g IV q12 hours based on calculations of pharmacokinetics. Will order trough level prior to the 21:00 dose on 7/10   Deondre Marinaro D 02/03/2015,8:56 AM

## 2015-02-03 NOTE — Consult Note (Signed)
Subjective: Patient seen foralcoholic hepatopathy,sub acute liver failure, hepatic encephalopathy.  Patietn will arouse, makes no meaningful responses.  Will not respond to questions.  Objective: Vital signs in last 24 hours: Temp:  [97.6 F (36.4 C)-98 F (36.7 C)] 97.8 F (36.6 C) (07/08 1200) Pulse Rate:  [50-78] 75 (07/08 1700) Resp:  [12-22] 21 (07/08 1700) BP: (93-114)/(59-87) 94/72 mmHg (07/08 1700) SpO2:  [87 %-98 %] 87 % (07/08 1700) Blood pressure 94/72, pulse 75, temperature 97.8 F (36.6 C), temperature source Oral, resp. rate 21, height 5\' 3"  (1.6 m), weight 67.132 kg (148 lb), SpO2 87 %.   Intake/Output from previous day: 07/07 0701 - 07/08 0700 In: 3294.8 [I.V.:2644.8; IV Piggyback:650] Out: 1225 [Urine:1225]  Intake/Output this shift: Total I/O In: 1551.7 [I.V.:1101.7; IV Piggyback:450] Out: 1020 [Urine:1020]   General appearance:  Deeply jaundiced noted as above Resp:  Clear to auscultation Cardio:  Regular rate and rhythm GI:  Mild to moderate distention, ascites,no rebound. Bowel sounds positive Extremities:  Clubbing or cyanosis   Lab Results: Results for orders placed or performed during the hospital encounter of 01/30/15 (from the past 24 hour(s))  CBC with Differential/Platelet     Status: Abnormal   Collection Time: 02/03/15  6:03 AM  Result Value Ref Range   WBC 8.8 3.6 - 11.0 K/uL   RBC 2.95 (L) 3.80 - 5.20 MIL/uL   Hemoglobin 10.2 (L) 12.0 - 16.0 g/dL   HCT 11.930.1 (L) 14.735.0 - 82.947.0 %   MCV 101.9 (H) 80.0 - 100.0 fL   MCH 34.6 (H) 26.0 - 34.0 pg   MCHC 34.0 32.0 - 36.0 g/dL   RDW 56.215.5 (H) 13.011.5 - 86.514.5 %   Platelets 111 (L) 150 - 440 K/uL   Neutrophils Relative % 79 %   Neutro Abs 6.9 (H) 1.4 - 6.5 K/uL   Lymphocytes Relative 10 %   Lymphs Abs 0.9 (L) 1.0 - 3.6 K/uL   Monocytes Relative 11 %   Monocytes Absolute 1.0 (H) 0.2 - 0.9 K/uL   Eosinophils Relative 0 %   Eosinophils Absolute 0.0 0 - 0.7 K/uL   Basophils Relative 0 %   Basophils  Absolute 0.0 0 - 0.1 K/uL  Comprehensive metabolic panel     Status: Abnormal   Collection Time: 02/03/15  6:03 AM  Result Value Ref Range   Sodium 135 135 - 145 mmol/L   Potassium 4.5 3.5 - 5.1 mmol/L   Chloride 103 101 - 111 mmol/L   CO2 25 22 - 32 mmol/L   Glucose, Bld 162 (H) 65 - 99 mg/dL   BUN 20 6 - 20 mg/dL   Creatinine, Ser 7.840.41 (L) 0.44 - 1.00 mg/dL   Calcium 9.0 8.9 - 69.610.3 mg/dL   Total Protein 6.3 (L) 6.5 - 8.1 g/dL   Albumin 3.5 3.5 - 5.0 g/dL   AST 295111 (H) 15 - 41 U/L   ALT 86 (H) 14 - 54 U/L   Alkaline Phosphatase 99 38 - 126 U/L   Total Bilirubin 21.3 (HH) 0.3 - 1.2 mg/dL   GFR calc non Af Amer >60 >60 mL/min   GFR calc Af Amer >60 >60 mL/min   Anion gap 7 5 - 15      Recent Labs  02/01/15 0437 02/02/15 0650 02/03/15 0603  WBC 20.3* 13.9* 8.8  HGB 10.4* 10.3* 10.2*  HCT 30.5* 30.0* 30.1*  PLT 154 130* 111*   BMET  Recent Labs  02/01/15 0437 02/02/15 0650 02/03/15 0603  NA 131*  133* 135  K 4.6 3.9 4.5  CL 100* 102 103  CO2 21* 24 25  GLUCOSE 112* 96 162*  BUN 28* 21* 20  CREATININE SEE COMMENTS UNABLE TO CALCULATE DUE TO ICTERUS 0.41*  CALCIUM 8.2* 8.7* 9.0   LFT  Recent Labs  02/03/15 0603  PROT 6.3*  ALBUMIN 3.5  AST 111*  ALT 86*  ALKPHOS 99  BILITOT 21.3*   PT/INR  Recent Labs  02/01/15 0437  LABPROT 25.6*  INR 2.32   Hepatitis Panel No results for input(s): HEPBSAG, HCVAB, HEPAIGM, HEPBIGM in the last 72 hours. C-Diff No results for input(s): CDIFFTOX in the last 72 hours. No results for input(s): CDIFFPCR in the last 72 hours.   Studies/Results: Dg Chest Port 1 View  02/02/2015   CLINICAL DATA:  Central line placement.  Hepatic encephalopathy.  EXAM: PORTABLE CHEST - 1 VIEW  COMPARISON:  01/30/2015  FINDINGS: Central line tip is in the superior vena cava. No pneumothorax. Slight bibasilar atelectasis, new on the left and increased on the right. Heart size and pulmonary vascularity are normal.  IMPRESSION: Central line  in good position. Increased atelectasis at the lung bases.   Electronically Signed   By: Francene Boyers M.D.   On: 02/02/2015 19:59    Scheduled Inpatient Medications:   . albumin human  25 g Intravenous Q8H  . feeding supplement (RESOURCE BREEZE)  1 Container Oral TID WC  . folic acid  1 mg Oral Daily  . lactulose  30 g Oral TID  . levETIRAcetam  500 mg Oral BID  . multivitamin with minerals  1 tablet Oral Daily  . pantoprazole (PROTONIX) IV  40 mg Intravenous Q12H  . piperacillin-tazobactam  3.375 g Intravenous 3 times per day  . predniSONE  40 mg Oral Q breakfast  . QUEtiapine Fumarate  150 mg Oral QHS  . sodium chloride  3 mL Intravenous Q12H  . thiamine  100 mg Oral Daily  . vancomycin  1,000 mg Intravenous Q12H    Continuous Inpatient Infusions:   . sodium chloride 100 mL/hr at 02/03/15 1225  . dexmedetomidine Stopped (02/03/15 1000)  . norepinephrine      PRN Inpatient Medications:  albuterol, ipratropium-albuterol, LORazepam, morphine injection, ondansetron **OR** ondansetron (ZOFRAN) IV, oxyCODONE  Miscellaneous:   Assessment:  1. 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  Plan:  1. Agree with Palliative care. Continue current doses of steroids and lactulose.could add Xifaxan 550 mg twice a day as an adjunct for the hepatic encephalopathy. 2. Appreciate renal and internal medicine assistance. Dr. Shelle Iron will round this weekend.  Christena Deem MD 02/03/2015, 6:51 PM

## 2015-02-03 NOTE — Consult Note (Signed)
Palliative Medicine Inpatient Consult Note   Name: Crystal [name] Date: 02/03/2015 MRN: [id number]  DOB: Jan 06, 1971  Referring Physician: Barbette ReichmannVishwanath Hande, MD  Palliative Care consult requested for this 44 y.o. female for goals of medical therapy in patient with a history of alcoholic cirrhosis,recurrent pancreatitis,  bipolar disorder, seizures due to alcohol withdrawal, past history of clipped aneurysm, and recent bloody emesis who presented to the hospital with a complaint of abdominal pain.  She was having tremors and abdominal distension and had turned somewhat yellow.  Her husband said she was more confused.  Her husband admist to supplying her with 4 to 5 'Bootleggers' per day (not sure what kind of liquor that is or how much--but record states this is equal to 30 drinks per week).  She was admitted to the ICU and is noted to have a total bilirubin greater than 22, small amount of ascites on ultrasound, and leukocytosis.  She reportedly smokes daily and has COPD. She had elevated troponins due to demand ischemia from liver failure.  She is not accepting lactulose much of the time and today was quite agitated and required some sedation while I was here.  She is not having BMs per staff.   She is currently Full Code. I spoke with nursing and also spoke with the patient's husband at some length.  He is the one who brings her the alcohol and he justifies this by saying that if he didn't have control over how much he gives her, she would get a dozen other people to bring her a whole lot more than he brings her.     REVIEW OF SYSTEMS:  Patient is not able to provide ROS due to hepatic encephalopathy  SPIRITUAL SUPPORT SYSTEM: No.  SOCIAL HISTORY:  reports that she has been smoking.  She has never used smokeless tobacco. She reports that she drinks about 18.0 oz of alcohol per week. She reports that she does not use illicit drugs.  LEGAL DOCUMENTS:  NONE  CODE STATUS: Full code  PAST MEDICAL  HISTORY: Past Medical History  Diagnosis Date  . Cirrhosis   . Brain aneurysm   . Pancreatitis   . Bipolar 1 disorder   . ADHD (attention deficit hyperactivity disorder)   . Anxiety   . Seizures   . Suicide attempt   . COPD (chronic obstructive pulmonary disease)   . GERD (gastroesophageal reflux disease)     PAST SURGICAL HISTORY:  Past Surgical History  Procedure Laterality Date  . Brain surgery    . Laproscopy      ALLERGIES:  is allergic to oxycontin and codeine.  MEDICATIONS:  Current Facility-Administered Medications  Medication Dose Route Frequency Provider Last Rate Last Dose  . 0.9 %  sodium chloride infusion   Intravenous Continuous Auburn BilberryShreyang Patel, MD 100 mL/hr at 02/03/15 1225    . albumin human 25 % solution 25 g  25 g Intravenous Q8H Munsoor Lateef, MD   25 g at 02/03/15 0841  . albuterol (PROVENTIL) (2.5 MG/3ML) 0.083% nebulizer solution 2.5 mg  2.5 mg Nebulization Q6H PRN Vishwanath Hande, MD      . dexmedetomidine (PRECEDEX) 400 MCG/100ML (4 mcg/mL) infusion  0.4-1.2 mcg/kg/hr Intravenous Titrated Barbette ReichmannVishwanath Hande, MD   Stopped at 02/03/15 1000  . feeding supplement (RESOURCE BREEZE) (RESOURCE BREEZE) liquid 1 Container  1 Container Oral TID WC Barbette ReichmannVishwanath Hande, MD   1 Container at 02/02/15 1626  . folic acid (FOLVITE) tablet 1 mg  1 mg Oral Daily Vishwanath Hande,  MD   Stopped at 02/03/15 1026  . ipratropium-albuterol (DUONEB) 0.5-2.5 (3) MG/3ML nebulizer solution 3 mL  3 mL Inhalation Q6H PRN Auburn Bilberry, MD      . lactulose (CHRONULAC) 10 GM/15ML solution 30 g  30 g Oral TID Gayla Doss, MD   Stopped at 02/02/15 2230  . levETIRAcetam (KEPPRA) tablet 500 mg  500 mg Oral BID Auburn Bilberry, MD   Stopped at 02/02/15 2231  . LORazepam (ATIVAN) injection 2-3 mg  2-3 mg Intravenous Q1H PRN Barbette Reichmann, MD   1 mg at 02/02/15 1733  . morphine 2 MG/ML injection 1 mg  1 mg Intravenous Q4H PRN Auburn Bilberry, MD   1 mg at 02/02/15 1607  . multivitamin with  minerals tablet 1 tablet  1 tablet Oral Daily Barbette Reichmann, MD   Stopped at 02/03/15 1027  . norepinephrine (LEVOPHED) 4mg  in D5W premix infusion  0-40 mcg/min Intravenous Titrated Vishwanath Hande, MD      . ondansetron (ZOFRAN) tablet 4 mg  4 mg Oral Q6H PRN Auburn Bilberry, MD       Or  . ondansetron (ZOFRAN) injection 4 mg  4 mg Intravenous Q6H PRN Auburn Bilberry, MD   4 mg at 01/30/15 2148  . oxyCODONE (Oxy IR/ROXICODONE) immediate release tablet 5 mg  5 mg Oral Q6H PRN Clydie Braun, MD   5 mg at 02/01/15 1402  . pantoprazole (PROTONIX) injection 40 mg  40 mg Intravenous Q12H Auburn Bilberry, MD   40 mg at 02/03/15 1030  . piperacillin-tazobactam (ZOSYN) IVPB 3.375 g  3.375 g Intravenous 3 times per day Auburn Bilberry, MD   3.375 g at 02/03/15 0540  . predniSONE (DELTASONE) tablet 40 mg  40 mg Oral Q breakfast Auburn Bilberry, MD   Stopped at 02/03/15 [phone]  . QUEtiapine (SEROQUEL XR) 24 hr tablet 150 mg  150 mg Oral QHS Auburn Bilberry, MD   Stopped at 02/02/15 2231  . sodium chloride 0.9 % injection 3 mL  3 mL Intravenous Q12H Auburn Bilberry, MD   3 mL at 02/02/15 2244  . thiamine (VITAMIN B-1) tablet 100 mg  100 mg Oral Daily Barbette Reichmann, MD   Stopped at 02/03/15 1028  . vancomycin (VANCOCIN) IVPB 1000 mg/200 mL premix  1,000 mg Intravenous Q12H Vishwanath Hande, MD   1,000 mg at 02/03/15 1145    Vital Signs: BP 102/83 mmHg  Pulse 58  Temp(Src) 97.8 F (36.6 C) (Oral)  Resp 13  Ht 5\' 3"  (1.6 m)  Wt 67.132 kg (148 lb)  BMI 26.22 kg/m2  SpO2 98%  LMP  Filed Weights   01/30/15 1128  Weight: 67.132 kg (148 lb)    Estimated body mass index is 26.22 kg/(m^2) as calculated from the following:   Height as of this encounter: 5\' 3"  (1.6 m).   Weight as of this encounter: 67.132 kg (148 lb).  PERFORMANCE STATUS (ECOG) : 4 - Bedbound  PHYSICAL EXAM: Agitated at first  Temporal Wasting noted OP clear --no blooding Neck no JVD or thyromegaly Hrt rrr no mgr Lungs  ronchi heard Abd soft and mildly distended Neuro --floridly confused but w/o motor weakness or facial assymetry noted Skin --very jaundiced   IMPRESSION: Acute on Chronic Alcoholic Liver Disease and Failure --survival is possible but she could be nearing end of life given severity of disease  --she is on prednisone --seems to be slightly more alert, but not taking lactulose and not having BMs so might need NG for Lactulose  administration--but defer to attending (Dr Marcello Fennel)  COPD --stable  Alcoholism  Bipolar Disorder  Agitation --sedation required.  Precedex.   Seizure Disorder  Noncompliance with medical regimen at home (per report)    PLAN:   I have explained the seriousness of patient's condition to her husband.  I discussed DNR vs Full code status.  Husband says he tends to agree with a DNR code status BUT he said he has to get HER MOTHER"S opinion first.  So he will call her and discuss that.  He will be back Friday later in afternoon.    Obtaining a DNR would be a first objective if family agrees with this.  Beyond that, comfort care can only begin once aggressive care is deemed futile due to an inability to survive despite interventions.  If she does survive and requires ongoing care, she would need a case management consult to determine if she could go to a facility setting where alcohol won't be supplied to her.         More than 50% of the visit was spent in counseling/coordination of care: Yes  Time Spent: 70 minutes

## 2015-02-03 NOTE — Progress Notes (Signed)
   02/03/15 0945  Clinical Encounter Type  Visited With Family  Visit Type Follow-up  Consult/Referral To Chaplain  Spiritual Encounters  Spiritual Needs Emotional  Stress Factors  Family Stress Factors None identified  Chaplain rounded in unit and offered support as applicable. Chaplain Mackynzie A. Ronen Bromwell Ext. [phone]

## 2015-02-03 NOTE — Care Management Note (Signed)
Case Management Note  Patient Details  Name: Crystal [name] MRN: [id number] Date of Birth: Jan 21, 1971  Subjective/Objective:   Increased agitation over night. Trying to crawl out of bed. Hepatic encephalopathy. ETOH withdrawal. Central Line place and Precedex gtt started                 Action/Plan:   Expected Discharge Date:                  Expected Discharge Plan:     In-House Referral:     Discharge planning Services     Post Acute Care Choice:    Choice offered to:     DME Arranged:    DME Agency:     HH Arranged:    HH Agency:     Status of Service:     Medicare Important Message Given:    Date Medicare IM Given:    Medicare IM give by:    Date Additional Medicare IM Given:    Additional Medicare Important Message give by:     If discussed at Long Length of Stay Meetings, dates discussed:    Additional Comments:  Marily MemosLisa M Harold Mattes, RN 02/03/2015, 9:06 AM

## 2015-02-04 LAB — PROTIME-INR
INR: 3.38
PROTHROMBIN TIME: 34.2 s — AB (ref 11.4–15.0)

## 2015-02-04 LAB — CBC WITH DIFFERENTIAL/PLATELET
Basophils Absolute: 0 10*3/uL (ref 0–0.1)
Basophils Relative: 0 %
EOS PCT: 2 %
Eosinophils Absolute: 0.3 10*3/uL (ref 0–0.7)
HEMATOCRIT: 29.1 % — AB (ref 35.0–47.0)
Hemoglobin: 10 g/dL — ABNORMAL LOW (ref 12.0–16.0)
LYMPHS ABS: 1.1 10*3/uL (ref 1.0–3.6)
LYMPHS PCT: 8 %
MCH: 35.1 pg — ABNORMAL HIGH (ref 26.0–34.0)
MCHC: 34.2 g/dL (ref 32.0–36.0)
MCV: 102.5 fL — ABNORMAL HIGH (ref 80.0–100.0)
Monocytes Absolute: 1.1 10*3/uL — ABNORMAL HIGH (ref 0.2–0.9)
Monocytes Relative: 8 %
NEUTROS ABS: 10.5 10*3/uL — AB (ref 1.4–6.5)
NEUTROS PCT: 82 %
PLATELETS: 110 10*3/uL — AB (ref 150–440)
RBC: 2.84 MIL/uL — ABNORMAL LOW (ref 3.80–5.20)
RDW: 15.5 % — ABNORMAL HIGH (ref 11.5–14.5)
WBC: 13 10*3/uL — AB (ref 3.6–11.0)

## 2015-02-04 LAB — COMPREHENSIVE METABOLIC PANEL
ALBUMIN: 3.8 g/dL (ref 3.5–5.0)
ALK PHOS: 77 U/L (ref 38–126)
ALT: 86 U/L — AB (ref 14–54)
ANION GAP: 6 (ref 5–15)
AST: 143 U/L — ABNORMAL HIGH (ref 15–41)
BUN: 18 mg/dL (ref 6–20)
CHLORIDE: 110 mmol/L (ref 101–111)
CO2: 24 mmol/L (ref 22–32)
Calcium: 9.3 mg/dL (ref 8.9–10.3)
Creatinine, Ser: <0.3 mg/dL — ABNORMAL LOW (ref 0.44–1.00)
Glucose, Bld: 83 mg/dL (ref 65–99)
POTASSIUM: 3.8 mmol/L (ref 3.5–5.1)
SODIUM: 140 mmol/L (ref 135–145)
Total Bilirubin: 20.3 mg/dL (ref 0.3–1.2)
Total Protein: 6.2 g/dL — ABNORMAL LOW (ref 6.5–8.1)

## 2015-02-04 LAB — AMMONIA: AMMONIA: 70 umol/L — AB (ref 9–35)

## 2015-02-04 LAB — CULTURE, BLOOD (ROUTINE X 2)
CULTURE: NO GROWTH
Culture: NO GROWTH
Specimen Description: 4

## 2015-02-04 MED ORDER — RIFAXIMIN 550 MG PO TABS: 550.0000 mg | ORAL_TABLET | Freq: Two times a day (BID) | ORAL | Status: DC

## 2015-02-04 MED ORDER — METHYLPREDNISOLONE SODIUM SUCC 40 MG IJ SOLR
40.0000 mg | Freq: Every day | INTRAMUSCULAR | Status: DC
Start: 2015-02-04 — End: 2015-02-06
  Administered 2015-02-04 – 2015-02-06 (×3): 40 mg via INTRAVENOUS
  Filled 2015-02-04 (×3): qty 1

## 2015-02-04 MED ORDER — HALOPERIDOL LACTATE 5 MG/ML IJ SOLN
2.0000 mg | Freq: Four times a day (QID) | INTRAMUSCULAR | Status: DC | PRN
  Administered 2015-02-04 (×2): 2 mg via INTRAVENOUS
  Filled 2015-02-04 (×2): qty 1

## 2015-02-04 NOTE — Progress Notes (Signed)
Spoke with Dr. Dareen PianoAnderson, updated on patients status, Not taking PO, NG attempted, etc. No further orders at this time.

## 2015-02-04 NOTE — Progress Notes (Signed)
Subjective:  Remains critically ill Na level has improved UOP 1420 cc  Objective:  Vital signs in last 24 hours:  Temp:  [97.7 F (36.5 C)-98.2 F (36.8 C)] 98.2 F (36.8 C) (07/09 0500) Pulse Rate:  [31-163] 118 (07/09 0900) Resp:  [13-32] 22 (07/09 0900) BP: (85-216)/(56-164) 85/63 mmHg (07/09 0900) SpO2:  [87 %-99 %] 93 % (07/09 0900)  Weight change:  Filed Weights   01/30/15 1128  Weight: 67.132 kg (148 lb)    Intake/Output: I/O last 3 completed shifts: In: 5044.9 [I.V.:3794.9; IV Piggyback:1250] Out: 2145 [Urine:2145]   Intake/Output this shift:     Physical Exam: General: NAD at present,   Head: dry oral mucosal membranes  Eyes: +icterus  Neck: Supple, trachea midline  Lungs:  Clear to auscultation normal effort  Heart: Regular rate and rhythm  Abdomen:  Soft, nontender, +ascites  Extremities:  ++ peripheral edema.  Neurologic: Resting quietly  Skin: +jaundice       Basic Metabolic Panel:  Recent Labs Lab 01/31/15 0406 02/01/15 0437 02/02/15 0650 02/03/15 0603 02/04/15 0516  NA 129* 131* 133* 135 140  K 5.5* 4.6 3.9 4.5 3.8  CL 99* 100* 102 103 110  CO2 21* 21* GLUCOSE 115* 112* 96 162* 83  BUN 26* 28* 21* 20 18  CREATININE NOT VALID SEE COMMENTS UNABLE TO CALCULATE DUE TO ICTERUS 0.41* <0.30*  CALCIUM 7.9* 8.2* 8.7* 9.0 9.3    Liver Function Tests:  Recent Labs Lab 01/31/15 0406 02/01/15 0437 02/02/15 0650 02/03/15 0603 02/04/15 0516  AST 287* 175* 131* 111* 143*  ALT 171* 135* 106* 86* 86*  ALKPHOS 149* 132* 114 99 77  BILITOT 24.0* 22.4* 22.5* 21.3* 20.3*  PROT 6.4* 6.0* 6.5 6.3* 6.2*  ALBUMIN 2.1* 2.3* 3.3* 3.5 3.8    Recent Labs Lab 01/30/15 1155  LIPASE 34    Recent Labs Lab 01/30/15 1240 02/01/15 1602 02/04/15 0516  AMMONIA 109* 91* 70*    CBC:  Recent Labs Lab 01/30/15 1155 01/31/15 0406 02/01/15 0437 02/02/15 0650 02/03/15 0603 02/04/15 0516  WBC 23.3* 21.7* 20.3* 13.9* 8.8 13.0*   NEUTROABS 17.1*  --  16.5* 10.8* 6.9* 10.5*  HGB 11.3* 11.0* 10.4* 10.3* 10.2* 10.0*  HCT 33.0* 32.3* 30.5* 30.0* 30.1* 29.1*  MCV 101.5* 101.0* 101.8* 101.7* 101.9* 102.5*  PLT 180 183 154 130* 111* 110*    Cardiac Enzymes:  Recent Labs Lab 01/30/15 1155 01/30/15 1728 01/31/15 0406  TROPONINI 0.07* 0.07* <0.03    BNP: Invalid input(s): POCBNP  CBG:  Recent Labs Lab 01/31/15 1241  GLUCAP 139*    Microbiology: Results for orders placed or performed during the hospital encounter of 01/30/15  Urine culture     Status: None   Collection Time: 01/30/15 12:41 PM  Result Value Ref Range Status   Specimen Description URINE, CATHETERIZED  Final   Special Requests Normal  Final   Culture NO GROWTH 1 DAY  Final   Report Status 02/01/2015 FINAL  Final  Blood culture (routine x 2)     Status: None (Preliminary result)   Collection Time: 01/30/15  1:18 PM  Result Value Ref Range Status   Specimen Description RIGHT ANTECUBITAL  Final   Special Requests   Final    BOTTLES DRAWN AEROBIC AND ANAEROBIC  5 CC AEROBIC, .5 CC ANAEROBIC   Culture NO GROWTH 4 DAYS  Final   Report Status PENDING  Incomplete  Blood culture (routine x 2)  Status: None (Preliminary result)   Collection Time: 01/30/15  1:20 PM  Result Value Ref Range Status   Specimen Description 4 BLOOD RIGHT  Final   Special Requests   Final    BOTTLES DRAWN AEROBIC AND ANAEROBIC  .5 CC AEROBIC, 2.5 CC ANAEROBIC   Culture NO GROWTH 4 DAYS  Final   Report Status PENDING  Incomplete  MRSA PCR Screening     Status: None   Collection Time: 01/31/15 12:48 PM  Result Value Ref Range Status   MRSA by PCR NEGATIVE NEGATIVE Final    Comment:        The GeneXpert MRSA Assay (FDA approved for NASAL specimens only), is one component of a comprehensive MRSA colonization surveillance program. It is not intended to diagnose MRSA infection nor to guide or monitor treatment for MRSA infections.     Coagulation  Studies:  Recent Labs  02/04/15 0516  LABPROT 34.2*  INR 3.38    Urinalysis: No results for input(s): COLORURINE, LABSPEC, PHURINE, GLUCOSEU, HGBUR, BILIRUBINUR, KETONESUR, PROTEINUR, UROBILINOGEN, NITRITE, LEUKOCYTESUR in the last 72 hours.  Invalid input(s): APPERANCEUR    Imaging: Dg Chest Port 1 View  02/02/2015   CLINICAL DATA:  Central line placement.  Hepatic encephalopathy.  EXAM: PORTABLE CHEST - 1 VIEW  COMPARISON:  01/30/2015  FINDINGS: Central line tip is in the superior vena cava. No pneumothorax. Slight bibasilar atelectasis, new on the left and increased on the right. Heart size and pulmonary vascularity are normal.  IMPRESSION: Central line in good position. Increased atelectasis at the lung bases.   Electronically Signed   By: Francene BoyersJames  Maxwell M.D.   On: 02/02/2015 19:59     Medications:   . sodium chloride 100 mL/hr (02/04/15 0839)  . dexmedetomidine 0.8 mcg/kg/hr (02/04/15 0830)  . norepinephrine     . albumin human  25 g Intravenous Q8H  . feeding supplement (RESOURCE BREEZE)  1 Container Oral TID WC  . folic acid  1 mg Oral Daily  . lactulose  30 g Oral TID  . levETIRAcetam  500 mg Oral BID  . multivitamin with minerals  1 tablet Oral Daily  . pantoprazole (PROTONIX) IV  40 mg Intravenous Q12H  . piperacillin-tazobactam  3.375 g Intravenous 3 times per day  . predniSONE  40 mg Oral Q breakfast  . QUEtiapine Fumarate  150 mg Oral QHS  . sodium chloride  3 mL Intravenous Q12H  . thiamine  100 mg Oral Daily  . vancomycin  1,000 mg Intravenous Q12H   albuterol, ipratropium-albuterol, LORazepam, morphine injection, ondansetron **OR** ondansetron (ZOFRAN) IV, oxyCODONE  Assessment/ Plan:  [name] is a 44 y.o. white female with cirrhosis of the liver, cerebral aneurysm, pancreatitis, bipolar disorder, seizure disorder, COPD, GERD, history of suicide attempt, who [facility]    1. Acute renal failure unspecified with  hyperkalemia: Concerned for prerenal azotemia. Urine output improved with NS and IV albumin.  -Renal status significantly improved, Cr down to 0.3.  BUN also down, good UOP of 1.4 liters.   Reduce iv fluids to 50 cc/hr  2. Hyponatremia: most likely due to end stage liver disease with cirrhosis.  - Na up to 140,  3. cirrhosis with ESLD - prognosis poor     LOS: 5 Doretta Remmert 7/9/20169:17 AM

## 2015-02-04 NOTE — Progress Notes (Signed)
ANTIBIOTIC CONSULT NOTE - FOLLOW UP  Pharmacy Consult for Vancomycin Indication: sepsis  Allergies  Allergen Reactions  . Oxycontin [Oxycodone Hcl] Itching  . Codeine Itching    Other reaction(s): Unknown    Patient Measurements: Height:  (160 cm) Weight: 148 lb (67.132 kg) IBW/kg (Calculated) : 52.4   Vital Signs: Temp: 98.3 F (36.8 C) (07/09 0800) Temp Source: Axillary (07/09 0800) BP: 91/62 mmHg (07/09 1100) Pulse Rate: 25 (07/09 1100) Intake/Output from previous day: 07/08 0701 - 07/09 0700 In: 3279.3 [I.V.:2479.3; IV Piggyback:800] Out: 1420 [Urine:1420] Intake/Output from this shift:    Labs:  Recent Labs  02/02/15 0650 02/03/15 0603 02/04/15 0516  WBC 13.9* 8.8 13.0*  HGB 10.3* 10.2* 10.0*  PLT 130* 111* 110*  CREATININE UNABLE TO CALCULATE DUE TO ICTERUS 0.41* <0.30*   CrCl cannot be calculated (Patient has no serum creatinine result on file.). No results for input(s): VANCOTROUGH, VANCOPEAK, VANCORANDOM, GENTTROUGH, GENTPEAK, GENTRANDOM, TOBRATROUGH, TOBRAPEAK, TOBRARND, AMIKACINPEAK, AMIKACINTROU, AMIKACIN in the last 72 hours.   Microbiology: Recent Results (from the past 720 hour(s))  Urine culture     Status: None   Collection Time: 01/30/15 12:41 PM  Result Value Ref Range Status   Specimen Description URINE, CATHETERIZED  Final   Special Requests Normal  Final   Culture NO GROWTH 1 DAY  Final   Report Status 02/01/2015 FINAL  Final  Blood culture (routine x 2)     Status: None   Collection Time: 01/30/15  1:18 PM  Result Value Ref Range Status   Specimen Description RIGHT ANTECUBITAL  Final   Special Requests   Final    BOTTLES DRAWN AEROBIC AND ANAEROBIC  5 CC AEROBIC, .5 CC ANAEROBIC   Culture NO GROWTH 5 DAYS  Final   Report Status 02/04/2015 FINAL  Final  Blood culture (routine x 2)     Status: None   Collection Time: 01/30/15  1:20 PM  Result Value Ref Range Status   Specimen Description 4 BLOOD RIGHT  Final   Special  Requests   Final    BOTTLES DRAWN AEROBIC AND ANAEROBIC  .5 CC AEROBIC, 2.5 CC ANAEROBIC   Culture NO GROWTH 5 DAYS  Final   Report Status 02/04/2015 FINAL  Final  MRSA PCR Screening     Status: None   Collection Time: 01/31/15 12:48 PM  Result Value Ref Range Status   MRSA by PCR NEGATIVE NEGATIVE Final    Comment:        The GeneXpert MRSA Assay (FDA approved for NASAL specimens only), is one component of a comprehensive MRSA colonization surveillance program. It is not intended to diagnose MRSA infection nor to guide or monitor treatment for MRSA infections.     Anti-infectives    Start     Dose/Rate Route Frequency Ordered Stop   02/04/15 1000  rifaximin (XIFAXAN) tablet 550 mg     550 mg Oral 2 times daily 02/04/15 0950     02/03/15 0900  vancomycin (VANCOCIN) IVPB 1000 mg/200 mL premix     1,000 mg 200 mL/hr over 60 Minutes Intravenous Every 12 hours 02/03/15 0854     02/01/15 0600  vancomycin (VANCOCIN) IVPB 1000 mg/200 mL premix  Status:  Discontinued     1,000 mg 200 mL/hr over 60 Minutes Intravenous Every 36 hours 01/30/15 2002 02/03/15 0854   01/30/15 2200  piperacillin-tazobactam (ZOSYN) IVPB 3.375 g     3.375 g 12.5 mL/hr over 240 Minutes Intravenous 3 times per day 01/30/15  1738     01/30/15 1630  piperacillin-tazobactam (ZOSYN) IVPB 3.375 g  Status:  Discontinued     3.375 g 100 mL/hr over 30 Minutes Intravenous 3 times per day 01/30/15 1618 01/30/15 1738   01/30/15 1515  vancomycin (VANCOCIN) IVPB 1000 mg/200 mL premix     1,000 mg 200 mL/hr over 60 Minutes Intravenous  Once 01/30/15 1500 01/30/15 1647   01/30/15 1445  piperacillin-tazobactam (ZOSYN) IVPB 3.375 g     3.375 g 12.5 mL/hr over 240 Minutes Intravenous  Once 01/30/15 1436 01/30/15 1547      Assessment: 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  Patient renal function has improved significantly. Currently the serum  creatinine is 0.41 mg/dl rounded to 0.8 for antimicrobial dosing and kinetics  PK parameters: Kel (hr-1): 0.068 Half-life (hrs): 10.19 Vd (liters): 46.97 (factor used: 0.7 L/kg)   Goal of Therapy:  Vancomycin trough level 15-20 mcg/ml  Plan:  Patient previously on Vancomycin 1 g IV q36 hours last dose @ ~2000 on 7/7. Will start Vancomycin 1 g IV q12 hours based on calculations of pharmacokinetics. Will order trough level prior to the 0900 dose on 7/10.  Clarisa Schoolsrystal Calen Geister, PharmD Clinical Pharmacist 02/04/2015

## 2015-02-04 NOTE — Progress Notes (Signed)
Dr. Shelle Ironein at bedside, requesting NG tube to deliver lactulose. 4 attempts unsuccessfull at this time, with assistance of PRN meds and Precedex gtt.  Dr. Shelle Ironein aware, stopping at this time.

## 2015-02-04 NOTE — Progress Notes (Signed)
Crystal [name] is a 44 y.o. female patient. 1. NSTEMI (non-ST elevated myocardial infarction)   2. Abdominal distension   3. Alcoholic hepatitis without ascites   4. Hepatic encephalopathy   5. Acute liver failure   6. Encounter for central line placement    Past Medical History  Diagnosis Date  . Cirrhosis   . Brain aneurysm   . Pancreatitis   . Bipolar 1 disorder   . ADHD (attention deficit hyperactivity disorder)   . Anxiety   . Seizures   . Suicide attempt   . COPD (chronic obstructive pulmonary disease)   . GERD (gastroesophageal reflux disease)    Current Facility-Administered Medications  Medication Dose Route Frequency Provider Last Rate Last Dose  . 0.9 %  sodium chloride infusion   Intravenous Continuous Mosetta Pigeon, MD 50 mL/hr at 02/04/15 0923    . albumin human 25 % solution 25 g  25 g Intravenous Q8H Munsoor Lateef, MD   25 g at 02/04/15 0826  . albuterol (PROVENTIL) (2.5 MG/3ML) 0.083% nebulizer solution 2.5 mg  2.5 mg Nebulization Q6H PRN Vishwanath Hande, MD      . dexmedetomidine (PRECEDEX) 400 MCG/100ML (4 mcg/mL) infusion  0.4-1.2 mcg/kg/hr Intravenous Titrated Vishwanath Hande, MD 13.4 mL/hr at 02/04/15 0830 0.8 mcg/kg/hr at 02/04/15 0830  . feeding supplement (RESOURCE BREEZE) (RESOURCE BREEZE) liquid 1 Container  1 Container Oral TID WC Barbette Reichmann, MD   1 Container at 02/02/15 1626  . folic acid (FOLVITE) tablet 1 mg  1 mg Oral Daily Barbette Reichmann, MD   Stopped at 02/03/15 1026  . ipratropium-albuterol (DUONEB) 0.5-2.5 (3) MG/3ML nebulizer solution 3 mL  3 mL Inhalation Q6H PRN Auburn Bilberry, MD      . lactulose (CHRONULAC) 10 GM/15ML solution 30 g  30 g Oral TID Gayla Doss, MD   Stopped at 02/02/15 2230  . levETIRAcetam (KEPPRA) tablet 500 mg  500 mg Oral BID Auburn Bilberry, MD   Stopped at 02/02/15 2231  . LORazepam (ATIVAN) injection 2-3 mg  2-3 mg Intravenous Q1H PRN Barbette Reichmann, MD   2 mg at 02/04/15 0133  . methylPREDNISolone sodium  succinate (SOLU-MEDROL) 40 mg/mL injection 40 mg  40 mg Intravenous Daily Harmeet Singh, MD      . morphine 2 MG/ML injection 1 mg  1 mg Intravenous Q4H PRN Auburn Bilberry, MD   1 mg at 02/03/15 2113  . multivitamin with minerals tablet 1 tablet  1 tablet Oral Daily Barbette Reichmann, MD   Stopped at 02/03/15 1027  . norepinephrine (LEVOPHED)  in D5W premix infusion  0-40 mcg/min Intravenous Titrated Vishwanath Hande, MD      . ondansetron (ZOFRAN) tablet 4 mg  4 mg Oral Q6H PRN Auburn Bilberry, MD       Or  . ondansetron (ZOFRAN) injection 4 mg  4 mg Intravenous Q6H PRN Auburn Bilberry, MD   4 mg at 01/30/15 2148  . oxyCODONE (Oxy IR/ROXICODONE) immediate release tablet 5 mg  5 mg Oral Q6H PRN Clydie Braun, MD   5 mg at 02/01/15 1402  . pantoprazole (PROTONIX) injection 40 mg  40 mg Intravenous Q12H Auburn Bilberry, MD   40 mg at 02/03/15 2250  . piperacillin-tazobactam (ZOSYN) IVPB 3.375 g  3.375 g Intravenous 3 times per day Auburn Bilberry, MD   3.375 g at 02/04/15 0600  . QUEtiapine (SEROQUEL XR) 24 hr tablet 150 mg  150 mg Oral QHS Auburn Bilberry, MD   Stopped at 02/02/15 2231  .  rifaximin (XIFAXAN) tablet 550 mg  550 mg Oral BID Lauro RegulusMarshall W Mark Hassey, MD      . sodium chloride 0.9 % injection 3 mL  3 mL Intravenous Q12H Auburn BilberryShreyang Patel, MD   3 mL at 02/03/15 2257  . thiamine (VITAMIN B-1) tablet 100 mg  100 mg Oral Daily Barbette ReichmannVishwanath Hande, MD   Stopped at 02/03/15 1028  . vancomycin (VANCOCIN) IVPB 1000 mg/200 mL premix  1,000 mg Intravenous Q12H Vishwanath Hande, MD   1,000 mg at 02/04/15 0929   Allergies  Allergen Reactions  . Oxycontin [Oxycodone Hcl] Itching  . Codeine Itching    Other reaction(s): Unknown   Principal Problem:   Alcohol dependence Active Problems:   Hepatic encephalopathy   Alcoholic hepatitis without ascites   Acute liver failure  Blood pressure 85/63, pulse 118, temperature 98.2 F (36.8 C), temperature source Axillary, resp. rate 22, height 5\' 3"  (1.6  m), weight 67.132 kg (148 lb), SpO2 93 %.  Subjective  44 y/o F with hx of Alcohol abuse, Bipolar disorder, Seizures from alcohol withdrawal,Anxiety, ADHD admitted with abdominal pain and altered mental status with increased lethargy consistent with Hepatic encephalopathy. Was anxious and agitated yesterday  This am is sedated from Precedex, confused at times and really not taking po Objective   General: Jaundiced. Sedated  Spider nevi and peteciae noted   Cardiovascular: S1 S2   Respiratory: Clear to auscultation  Abdomen: distended/ Ascites +  Neuro:Sedated  Assessment & Plan  Altered mental status secondary to acute Hepatic encephalopathy and Cirrhosis of liver  1-On lactulose and steriods, not taking po much if any now 2 Anxiety and agitation secondary to DT's: On Precedex- Very slow to clear 3 Leukocytosis: Possible sepsis- Improved-- On Zosyn and Vanc  4 Elevated Troponin secondary to Demand ischemia  5 Seizure disorder: On Keppra  6 Acute Renal failure/ Hypotension and Metabolic acidosis with possible Hepatorenal syndrome Continue IV hydration 7 Hyponatremia; Slowly improving- Continue to monitor 8 Bipolar; Making above more difficult and not taking meds well, add prn haldol Jasime Westergren W. 02/04/2015

## 2015-02-04 NOTE — Progress Notes (Signed)
GI Inpatient Follow-up Note  Patient Identification: Crystal [name] is a 44 y.o. female with ETOH cirrhosis, now with worsening liver failure  Subjective:  Still very altered today. No chance clinically. She cannot give an hx. Per the nursing staff, no bleeding, no stool.   Scheduled Inpatient Medications:  . albumin human  25 g Intravenous Q8H  . feeding supplement (RESOURCE BREEZE)  1 Container Oral TID WC  . folic acid  1 mg Oral Daily  . lactulose  30 g Oral TID  . levETIRAcetam  500 mg Oral BID  . methylPREDNISolone (SOLU-MEDROL) injection  40 mg Intravenous Daily  . multivitamin with minerals  1 tablet Oral Daily  . pantoprazole (PROTONIX) IV  40 mg Intravenous Q12H  . piperacillin-tazobactam  3.375 g Intravenous 3 times per day  . QUEtiapine Fumarate  150 mg Oral QHS  . rifaximin  550 mg Oral BID  . sodium chloride  3 mL Intravenous Q12H  . thiamine  100 mg Oral Daily  . vancomycin  1,000 mg Intravenous Q12H    Continuous Inpatient Infusions:   . sodium chloride 50 mL/hr at 02/04/15 0923  . dexmedetomidine Stopped (02/04/15 1128)  . norepinephrine      PRN Inpatient Medications:  albuterol, haloperidol lactate, ipratropium-albuterol, LORazepam, morphine injection, ondansetron **OR** ondansetron (ZOFRAN) IV, oxyCODONE     Physical Examination: BP 93/62 mmHg  Pulse 71  Temp(Src) 98.3 F (36.8 C) (Axillary)  Resp 21  Ht 5\' 3"  (1.6 m)  Wt 67.132 kg (148 lb)  BMI 26.22 kg/m2  SpO2 93%  LMP  Gen: jaundicerd, a and o x0, moans some Neck: supple, no JVD or thyromegaly Chest:  + crackels bilat, no wheeze, decrased air entry CV: RRR, no m/g/c/r Abd: + distended, no r/g, nt Ext: bilat edema Skin: no rash or lesions noted, + jaundiced  Data: Lab Results  Component Value Date   WBC 13.0* 02/04/2015   HGB 10.0* 02/04/2015   HCT 29.1* 02/04/2015   MCV 102.5* 02/04/2015   PLT 110* 02/04/2015    Recent Labs Lab 02/02/15 0650 02/03/15 0603 02/04/15 0516   HGB 10.3* 10.2* 10.0*   Lab Results  Component Value Date   NA 140 02/04/2015   K 3.8 02/04/2015   CL 110 02/04/2015   CO2 24 02/04/2015   BUN 18 02/04/2015   CREATININE <0.30* 02/04/2015   Lab Results  Component Value Date   ALT 86* 02/04/2015   AST 143* 02/04/2015   ALKPHOS 77 02/04/2015   BILITOT 20.3* 02/04/2015    Recent Labs Lab 01/30/15 1542  02/04/15 0516  APTT 52*  --   --   INR  --   < > 3.38  < > = values in this interval not displayed.   Assessment/Plan: [name] is a 44 y.o. female with ETOH cirrhosis, now with worsening liver failure.  Her INR is out more today to 3.4 and mental status is poor.  Suspect she is nearing the end of her life. She is not a liver transplant candidate so no treatment options.  Recommend discussion with family regarding comfort care measures.     Please call with questions or concerns.  REIN, Addison NaegeliMATTHEW GORDON, MD

## 2015-02-05 LAB — CBC WITH DIFFERENTIAL/PLATELET
BASOS ABS: 0 10*3/uL (ref 0–0.1)
BASOS PCT: 0 %
Eosinophils Absolute: 0.2 10*3/uL (ref 0–0.7)
Eosinophils Relative: 1 %
HEMATOCRIT: 29.9 % — AB (ref 35.0–47.0)
HEMOGLOBIN: 10.1 g/dL — AB (ref 12.0–16.0)
LYMPHS ABS: 1.6 10*3/uL (ref 1.0–3.6)
Lymphocytes Relative: 10 %
MCH: 35 pg — ABNORMAL HIGH (ref 26.0–34.0)
MCHC: 33.9 g/dL (ref 32.0–36.0)
MCV: 103.2 fL — AB (ref 80.0–100.0)
Monocytes Absolute: 0.5 10*3/uL (ref 0.2–0.9)
Monocytes Relative: 3 %
Neutro Abs: 13.4 10*3/uL — ABNORMAL HIGH (ref 1.4–6.5)
Neutrophils Relative %: 86 %
Platelets: 107 10*3/uL — ABNORMAL LOW (ref 150–440)
RBC: 2.9 MIL/uL — ABNORMAL LOW (ref 3.80–5.20)
RDW: 15.5 % — AB (ref 11.5–14.5)
WBC: 15.7 10*3/uL — ABNORMAL HIGH (ref 3.6–11.0)

## 2015-02-05 LAB — COMPREHENSIVE METABOLIC PANEL
ALBUMIN: 4.1 g/dL (ref 3.5–5.0)
ALK PHOS: 72 U/L (ref 38–126)
ALT: 73 U/L — AB (ref 14–54)
ANION GAP: 5 (ref 5–15)
AST: 92 U/L — ABNORMAL HIGH (ref 15–41)
BUN: 22 mg/dL — ABNORMAL HIGH (ref 6–20)
CO2: 24 mmol/L (ref 22–32)
Calcium: 9.4 mg/dL (ref 8.9–10.3)
Chloride: 109 mmol/L (ref 101–111)
Creatinine, Ser: 0.57 mg/dL (ref 0.44–1.00)
GFR calc Af Amer: >60 mL/min (ref >60)
GFR calc non Af Amer: >60 mL/min (ref >60)
Glucose, Bld: 146 mg/dL — ABNORMAL HIGH (ref 65–99)
POTASSIUM: 4.2 mmol/L (ref 3.5–5.1)
Sodium: 138 mmol/L (ref 135–145)
TOTAL PROTEIN: 6.5 g/dL (ref 6.5–8.1)
Total Bilirubin: 20.1 mg/dL (ref 0.3–1.2)

## 2015-02-05 LAB — VANCOMYCIN, TROUGH
Vancomycin Tr: 26 ug/mL (ref 10–20)
Vancomycin Tr: 27 ug/mL (ref 10–20)

## 2015-02-05 LAB — PROTIME-INR
INR: 3.15
PROTHROMBIN TIME: 32.4 s — AB (ref 11.4–15.0)

## 2015-02-05 MED ORDER — VANCOMYCIN HCL IN DEXTROSE 1-5 GM/200ML-% IV SOLN
1000.0000 mg | INTRAVENOUS | Status: DC
  Administered 2015-02-06: 1000 mg via INTRAVENOUS
  Filled 2015-02-05 (×3): qty 200

## 2015-02-05 NOTE — Care Management Note (Signed)
Case Management Note  Patient Details  Name: Crystal [name] MRN: [id number] Date of Birth: 01-Nov-1970  Subjective/Objective:        Per ICU nurse Adam there is [name] signed DNR form on Crystal [name] chart. [facility] and agree that is where they would like Crystal [name] to be discharged to. Case management has initiated [name] and will fax over an MD order as soon as it is available.             Action/Plan:   Expected Discharge Date:                  Expected Discharge Plan:     In-House Referral:     Discharge planning Services     Post Acute Care Choice:    Choice offered to:     DME Arranged:    DME Agency:     HH Arranged:    HH Agency:     Status of Service:     Medicare Important Message Given:    Date Medicare IM Given:    Medicare IM give by:    Date Additional Medicare IM Given:    Additional Medicare Important Message give by:     If discussed at Long Length of Stay Meetings, dates discussed:    Additional Comments:  Crystal Cinnamon A, RN 02/05/2015, 3:31 PM

## 2015-02-05 NOTE — Progress Notes (Signed)
Spoke with patients spouse about Placement in Hospice Home.  He denies questions.  He has gone to the hospice home and signed consent for the patient to be admitted. MD Dareen PianoAnderson aware, Case manager Larita FifeLynn aware, [facility].  Anticipating transfer on Monday.

## 2015-02-05 NOTE — Progress Notes (Signed)
ANTIBIOTIC CONSULT NOTE - FOLLOW UP  Pharmacy Consult for Vancomycin Indication: Sepsis  Allergies  Allergen Reactions  . Oxycontin [Oxycodone Hcl] Itching  . Codeine Itching    Other reaction(s): Unknown    Patient Measurements: Height:  (160 cm) Weight: 148 lb (67.132 kg) IBW/kg (Calculated) : 52.4 Adjusted Body Weight: n/a  Vital Signs: BP: 123/88 mmHg (07/10 2000) Pulse Rate: 61 (07/10 2000) Intake/Output from previous day: 07/09 0701 - 07/10 0700 In: 2411.7 [I.V.:1561.7; IV Piggyback:850] Out: 650 [Urine:650] Intake/Output from this shift:    Labs:  Recent Labs  02/03/15 0603 02/04/15 0516 02/05/15 0634  WBC 8.8 13.0* 15.7*  HGB 10.2* 10.0* 10.1*  PLT 111* 110* 107*  CREATININE 0.41* <0.30* 0.57   Estimated Creatinine Clearance: 85.2 mL/min (by C-G formula based on Cr of 0.57).  Recent Labs  02/05/15 0852 02/05/15 2027  VANCOTROUGH 26* 27*     Microbiology: Recent Results (from the past 720 hour(s))  Urine culture     Status: None   Collection Time: 01/30/15 12:41 PM  Result Value Ref Range Status   Specimen Description URINE, CATHETERIZED  Final   Special Requests Normal  Final   Culture NO GROWTH 1 DAY  Final   Report Status 02/01/2015 FINAL  Final  Blood culture (routine x 2)     Status: None   Collection Time: 01/30/15  1:18 PM  Result Value Ref Range Status   Specimen Description RIGHT ANTECUBITAL  Final   Special Requests   Final    BOTTLES DRAWN AEROBIC AND ANAEROBIC  5 CC AEROBIC, .5 CC ANAEROBIC   Culture NO GROWTH 5 DAYS  Final   Report Status 02/04/2015 FINAL  Final  Blood culture (routine x 2)     Status: None   Collection Time: 01/30/15  1:20 PM  Result Value Ref Range Status   Specimen Description 4 BLOOD RIGHT  Final   Special Requests   Final    BOTTLES DRAWN AEROBIC AND ANAEROBIC  .5 CC AEROBIC, 2.5 CC ANAEROBIC   Culture NO GROWTH 5 DAYS  Final   Report Status 02/04/2015 FINAL  Final  MRSA PCR Screening     Status:  None   Collection Time: 01/31/15 12:48 PM  Result Value Ref Range Status   MRSA by PCR NEGATIVE NEGATIVE Final    Comment:        The GeneXpert MRSA Assay (FDA approved for NASAL specimens only), is one component of a comprehensive MRSA colonization surveillance program. It is not intended to diagnose MRSA infection nor to guide or monitor treatment for MRSA infections.     Anti-infectives    Start     Dose/Rate Route Frequency Ordered Stop   02/06/15 0200  vancomycin (VANCOCIN) IVPB 1000 mg/200 mL premix     1,000 mg 200 mL/hr over 60 Minutes Intravenous Every 18 hours 02/05/15 2104     02/04/15 1000  rifaximin (XIFAXAN) tablet 550 mg     550 mg Oral 2 times daily 02/04/15 0950     02/03/15 0900  vancomycin (VANCOCIN) IVPB 1000 mg/200 mL premix  Status:  Discontinued     1,000 mg 200 mL/hr over 60 Minutes Intravenous Every 12 hours 02/03/15 0854 02/05/15 0930   02/01/15 0600  vancomycin (VANCOCIN) IVPB 1000 mg/200 mL premix  Status:  Discontinued     1,000 mg 200 mL/hr over 60 Minutes Intravenous Every 36 hours 01/30/15 2002 02/03/15 0854   01/30/15 2200  piperacillin-tazobactam (ZOSYN) IVPB 3.375 g  3.375 g 12.5 mL/hr over 240 Minutes Intravenous 3 times per day 01/30/15 1738     01/30/15 1630  piperacillin-tazobactam (ZOSYN) IVPB 3.375 g  Status:  Discontinued     3.375 g 100 mL/hr over 30 Minutes Intravenous 3 times per day 01/30/15 1618 01/30/15 1738   01/30/15 1515  vancomycin (VANCOCIN) IVPB 1000 mg/200 mL premix     1,000 mg 200 mL/hr over 60 Minutes Intravenous  Once 01/30/15 1500 01/30/15 1647   01/30/15 1445  piperacillin-tazobactam (ZOSYN) IVPB 3.375 g     3.375 g 12.5 mL/hr over 240 Minutes Intravenous  Once 01/30/15 1436 01/30/15 1547      Assessment: Vancomycin level returned at 26 mcg/ml 12 hours after previous dose.   Goal of Therapy:  Vancomycin trough level 15-20 mcg/ml  Plan:  Measure antibiotic drug levels at steady state Follow up culture  results Will decrease to Vancomycin 1g IV q18h.  Clovia CuffLisa Iann Rodier, PharmD, BCPS 02/05/2015 9:06 PM

## 2015-02-05 NOTE — Progress Notes (Signed)
Crystal [name] is a 44 y.o. female patient. 1. NSTEMI (non-ST elevated myocardial infarction)   2. Abdominal distension   3. Alcoholic hepatitis without ascites   4. Hepatic encephalopathy   5. Acute liver failure   6. Encounter for central line placement    Past Medical History  Diagnosis Date  . Cirrhosis   . Brain aneurysm   . Pancreatitis   . Bipolar 1 disorder   . ADHD (attention deficit hyperactivity disorder)   . Anxiety   . Seizures   . Suicide attempt   . COPD (chronic obstructive pulmonary disease)   . GERD (gastroesophageal reflux disease)    Current Facility-Administered Medications  Medication Dose Route Frequency Provider Last Rate Last Dose  . 0.9 %  sodium chloride infusion   Intravenous Continuous Mosetta PigeonHarmeet Singh, MD 50 mL/hr at 02/05/15 0426    . albumin human 25 % solution 25 g  25 g Intravenous Q8H Munsoor Lateef, MD   25 g at 02/05/15 0820  . albuterol (PROVENTIL) (2.5 MG/3ML) 0.083% nebulizer solution 2.5 mg  2.5 mg Nebulization Q6H PRN Vishwanath Hande, MD      . dexmedetomidine (PRECEDEX) 400 MCG/100ML (4 mcg/mL) infusion  0.4-1.2 mcg/kg/hr Intravenous Titrated Vishwanath Hande, MD 10.1 mL/hr at 02/05/15 0500 0.6 mcg/kg/hr at 02/05/15 0500  . feeding supplement (RESOURCE BREEZE) (RESOURCE BREEZE) liquid 1 Container  1 Container Oral TID WC Barbette ReichmannVishwanath Hande, MD   1 Container at 02/02/15 1626  . folic acid (FOLVITE) tablet 1 mg  1 mg Oral Daily Vishwanath Hande, MD   Stopped at 02/03/15 1026  . haloperidol lactate (HALDOL) injection 2 mg  2 mg Intravenous Q6H PRN Lauro RegulusMarshall W Zuhair Lariccia, MD   2 mg at 02/04/15 2135  . ipratropium-albuterol (DUONEB) 0.5-2.5 (3) MG/3ML nebulizer solution 3 mL  3 mL Inhalation Q6H PRN Auburn BilberryShreyang Patel, MD      . lactulose (CHRONULAC) 10 GM/15ML solution 30 g  30 g Oral TID Gayla DossEryka A Gayle, MD   Stopped at 02/02/15 2230  . levETIRAcetam (KEPPRA) tablet 500 mg  500 mg Oral BID Auburn BilberryShreyang Patel, MD   Stopped at 02/02/15 2231  . LORazepam (ATIVAN)  injection 2-3 mg  2-3 mg Intravenous Q1H PRN Barbette ReichmannVishwanath Hande, MD   2 mg at 02/04/15 2228  . methylPREDNISolone sodium succinate (SOLU-MEDROL) 40 mg/mL injection 40 mg  40 mg Intravenous Daily Harmeet Singh, MD   40 mg at 02/04/15 1118  . morphine 2 MG/ML injection 1 mg  1 mg Intravenous Q4H PRN Auburn BilberryShreyang Patel, MD   1 mg at 02/04/15 1620  . multivitamin with minerals tablet 1 tablet  1 tablet Oral Daily Barbette ReichmannVishwanath Hande, MD   Stopped at 02/03/15 1027  . norepinephrine (LEVOPHED) 4mg  in D5W 250mL premix infusion  0-40 mcg/min Intravenous Titrated Vishwanath Hande, MD      . ondansetron (ZOFRAN) tablet 4 mg  4 mg Oral Q6H PRN Auburn BilberryShreyang Patel, MD       Or  . ondansetron (ZOFRAN) injection 4 mg  4 mg Intravenous Q6H PRN Auburn BilberryShreyang Patel, MD   4 mg at 01/30/15 2148  . oxyCODONE (Oxy IR/ROXICODONE) immediate release tablet 5 mg  5 mg Oral Q6H PRN Clydie Braunavid Fitzgerald, MD   5 mg at 02/01/15 1402  . pantoprazole (PROTONIX) injection 40 mg  40 mg Intravenous Q12H Auburn BilberryShreyang Patel, MD   40 mg at 02/04/15 2217  . piperacillin-tazobactam (ZOSYN) IVPB 3.375 g  3.375 g Intravenous 3 times per day Auburn BilberryShreyang Patel, MD   3.375 g at  02/05/15 0820  . QUEtiapine (SEROQUEL XR) 24 hr tablet 150 mg  150 mg Oral QHS Auburn Bilberry, MD   Stopped at 02/02/15 2231  . rifaximin (XIFAXAN) tablet 550 mg  550 mg Oral BID Lauro Regulus, MD   550 mg at 02/04/15 1411  . sodium chloride 0.9 % injection 3 mL  3 mL Intravenous Q12H Auburn Bilberry, MD   3 mL at 02/04/15 2217  . thiamine (VITAMIN B-1) tablet 100 mg  100 mg Oral Daily Barbette Reichmann, MD   Stopped at 02/03/15 1028   Allergies  Allergen Reactions  . Oxycontin [Oxycodone Hcl] Itching  . Codeine Itching    Other reaction(s): Unknown   Principal Problem:   Alcohol dependence Active Problems:   Hepatic encephalopathy   Alcoholic hepatitis without ascites   Acute liver failure  Blood pressure 102/78, pulse 62, temperature 97.7 F (36.5 C), temperature source Oral,  resp. rate 13, height  (1.6 m), weight 67.132 kg (148 lb), SpO2 98 %.  Subjective  44 y/o F with hx of Alcohol abuse, Bipolar disorder, Seizures from alcohol withdrawal,Anxiety, ADHD admitted with abdominal pain and altered mental status with increased lethargy consistent with Hepatic encephalopathy. Sedate and calm today, no new events and not able to take any po now, cannot get more hx or ros Objective  Nad, jaundiced, calm Chest; clear Heart; rrr Abd; distended, qiet Ext; 1+ edema Assessment & Plan   Altered mental status secondary to acute Hepatic encephalopathy and Cirrhosis of liver  1-On lactulose and steriods, not taking po much if any now so no real tx is available 2 Anxiety and agitation secondary to DT's: Prn meds and precedex 3 Leukocytosis: Possible sepsis- waxes and wanes 4 Elevated Troponin secondary to Demand ischemia  5 Seizure disorder: On Keppra  6 Acute Renal failure/ Hypotension and Metabolic acidosis with possible Hepatorenal syndrome Continue IV hydration, careful with overload 7 Hyponatremia; Slowly improving- Continue to monitor 8 Bipolar; Making above more difficult and not taking meds well, add prn haldol 9- Overall prognosis; Extremely poor, make her nmp, working on hospice but more difficult as palliative is not currently available Signed Einar Crow W. 02/05/2015

## 2015-02-05 NOTE — Progress Notes (Signed)
GI Inpatient Follow-up Note  Patient Identification: Krisy R Herbers is a 44 y.o. Aram Candelafemale with ETOH cirrhosis, liver failure  Subjective:  No change clinically.  Still unresponsive.    Scheduled Inpatient Medications:  . albumin human  25 g Intravenous Q8H  . feeding supplement (RESOURCE BREEZE)  1 Container Oral TID WC  . folic acid  1 mg Oral Daily  . lactulose  30 g Oral TID  . levETIRAcetam  500 mg Oral BID  . methylPREDNISolone (SOLU-MEDROL) injection  40 mg Intravenous Daily  . multivitamin with minerals  1 tablet Oral Daily  . pantoprazole (PROTONIX) IV  40 mg Intravenous Q12H  . piperacillin-tazobactam  3.375 g Intravenous 3 times per day  . QUEtiapine Fumarate  150 mg Oral QHS  . rifaximin  550 mg Oral BID  . sodium chloride  3 mL Intravenous Q12H  . thiamine  100 mg Oral Daily    Continuous Inpatient Infusions:   . sodium chloride 50 mL/hr at 02/05/15 0426  . dexmedetomidine 0.6 mcg/kg/hr (02/05/15 0500)  . norepinephrine      PRN Inpatient Medications:  albuterol, haloperidol lactate, ipratropium-albuterol, LORazepam, morphine injection, ondansetron **OR** ondansetron (ZOFRAN) IV, oxyCODONE     Physical Examination: BP 119/91 mmHg  Pulse 56  Temp(Src) 98.1 F (36.7 C) (Axillary)  Resp 12  Ht 5\' 3"  (1.6 m)  Wt 67.132 kg (148 lb)  BMI 26.22 kg/m2  SpO2 97%  LMP  Gen: NAD, alert and oriented x 0 Chest: coarse and decreased biat.  CV: RRR, no m/g/c/r Abd: +_ distended, decreased bowel sounds.  Ext: + edema bilat, well perf Skin:+ jaundice, + spider angiomas   Data: Lab Results  Component Value Date   WBC 15.7* 02/05/2015   HGB 10.1* 02/05/2015   HCT 29.9* 02/05/2015   MCV 103.2* 02/05/2015   PLT 107* 02/05/2015    Recent Labs Lab 02/03/15 0603 02/04/15 0516 02/05/15 0634  HGB 10.2* 10.0* 10.1*   Lab Results  Component Value Date   NA 138 02/05/2015   K 4.2 02/05/2015   CL 109 02/05/2015   CO2 24 02/05/2015   BUN 22* 02/05/2015   CREATININE 0.57 02/05/2015   Lab Results  Component Value Date   ALT 73* 02/05/2015   AST 92* 02/05/2015   ALKPHOS 72 02/05/2015   BILITOT 20.1* 02/05/2015    Recent Labs Lab 01/30/15 1542  02/04/15 0516  APTT 52*  --   --   INR  --   < > 3.38  < > = values in this interval not displayed.   Assessment/Plan: [name] is a 44 y.o. female with ETOH cirrhosis, now with wiorsenign subacute liver failure with poor synthetic function as evidenced by worsenign INR, AMS.   Recommendations: - palliative care consult - repeat INR today to help with prognosis.   Please call with questions or concerns.  REIN, Addison NaegeliMATTHEW GORDON, MD

## 2015-02-05 NOTE — Progress Notes (Addendum)
ANTIBIOTIC CONSULT NOTE - FOLLOW UP  Pharmacy Consult for Vancomycin/Zosyn Indication: sepsis  Allergies  Allergen Reactions  . Oxycontin [Oxycodone Hcl] Itching  . Codeine Itching    Other reaction(s): Unknown    Patient Measurements: Height: 5\' 3"  (160 cm) Weight: 148 lb (67.132 kg) IBW/kg (Calculated) : 52.4   Vital Signs: Temp: 97.7 F (36.5 C) (07/10 0402) Temp Source: Oral (07/10 0402) BP: 102/78 mmHg (07/10 0600) Pulse Rate: 62 (07/10 0600) Intake/Output from previous day: 07/09 0701 - 07/10 0700 In: 2411.7 [I.V.:1561.7; IV Piggyback:850] Out: 650 [Urine:650] Intake/Output from this shift:    Labs:  Recent Labs  02/03/15 0603 02/04/15 0516 02/05/15 0634  WBC 8.8 13.0* 15.7*  HGB 10.2* 10.0* 10.1*  PLT 111* 110* 107*  CREATININE 0.41* <0.30* 0.57   Estimated Creatinine Clearance: 85.2 mL/min (by C-G formula based on Cr of 0.57).  Recent Labs  02/05/15 0852  VANCOTROUGH 26*     Microbiology: Recent Results (from the past 720 hour(s))  Urine culture     Status: None   Collection Time: 01/30/15 12:41 PM  Result Value Ref Range Status   Specimen Description URINE, CATHETERIZED  Final   Special Requests Normal  Final   Culture NO GROWTH 1 DAY  Final   Report Status 02/01/2015 FINAL  Final  Blood culture (routine x 2)     Status: None   Collection Time: 01/30/15  1:18 PM  Result Value Ref Range Status   Specimen Description RIGHT ANTECUBITAL  Final   Special Requests   Final    BOTTLES DRAWN AEROBIC AND ANAEROBIC  5 CC AEROBIC, .5 CC ANAEROBIC   Culture NO GROWTH 5 DAYS  Final   Report Status 02/04/2015 FINAL  Final  Blood culture (routine x 2)     Status: None   Collection Time: 01/30/15  1:20 PM  Result Value Ref Range Status   Specimen Description 4 BLOOD RIGHT  Final   Special Requests   Final    BOTTLES DRAWN AEROBIC AND ANAEROBIC  .5 CC AEROBIC, 2.5 CC ANAEROBIC   Culture NO GROWTH 5 DAYS  Final   Report Status 02/04/2015 FINAL  Final   MRSA PCR Screening     Status: None   Collection Time: 01/31/15 12:48 PM  Result Value Ref Range Status   MRSA by PCR NEGATIVE NEGATIVE Final    Comment:        The GeneXpert MRSA Assay (FDA approved for NASAL specimens only), is one component of a comprehensive MRSA colonization surveillance program. It is not intended to diagnose MRSA infection nor to guide or monitor treatment for MRSA infections.     Anti-infectives    Start     Dose/Rate Route Frequency Ordered Stop   02/04/15 1000  rifaximin (XIFAXAN) tablet 550 mg     550 mg Oral 2 times daily 02/04/15 0950     02/03/15 0900  vancomycin (VANCOCIN) IVPB 1000 mg/200 mL premix  Status:  Discontinued     1,000 mg 200 mL/hr over 60 Minutes Intravenous Every 12 hours 02/03/15 0854 02/05/15 0930   02/01/15 0600  vancomycin (VANCOCIN) IVPB 1000 mg/200 mL premix  Status:  Discontinued     1,000 mg 200 mL/hr over 60 Minutes Intravenous Every 36 hours 01/30/15 2002 02/03/15 0854   01/30/15 2200  piperacillin-tazobactam (ZOSYN) IVPB 3.375 g     3.375 g 12.5 mL/hr over 240 Minutes Intravenous 3 times per day 01/30/15 1738     01/30/15 1630  piperacillin-tazobactam (ZOSYN)  IVPB 3.375 g  Status:  Discontinued     3.375 g 100 mL/hr over 30 Minutes Intravenous 3 times per day 01/30/15 1618 01/30/15 1738   01/30/15 1515  vancomycin (VANCOCIN) IVPB 1000 mg/200 mL premix     1,000 mg 200 mL/hr over 60 Minutes Intravenous  Once 01/30/15 1500 01/30/15 1647   01/30/15 1445  piperacillin-tazobactam (ZOSYN) IVPB 3.375 g     3.375 g 12.5 mL/hr over 240 Minutes Intravenous  Once 01/30/15 1436 01/30/15 1547      Assessment: 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  Patient renal function has improved significantly. Currently the serum creatinine is 0.41 mg/dl rounded to 0.8 for antimicrobial dosing and kinetics  PK parameters: Kel (hr-1): 0.068 Half-life (hrs): 10.19  Vd (liters): 46.97 (factor used: 0.7 L/kg)  Trough on 7/10 at 0852 of 26  Vancomycin dose charted as hung at 0820.  Goal of Therapy:  Vancomycin trough level 15-20 mcg/ml  Plan:  Vancomycin 1 gm IV q12h ordered. Vancomycin trough of 26.  Vancomycin infusion charted today at 39.  Will check another trough at 2030 (~12 hours after 1 gm dose administered).  Concerned that trough may be falsely high as drug was infusing when trough was drawn.  Will discontinue current orders and evaluate follow up trough tonight to assess need for dose change.    Continue Zosyn 3.375 gm IV q8h per EI protocol.  Clarisa Schools, PharmD Clinical Pharmacist 02/05/2015

## 2015-02-06 LAB — CBC WITH DIFFERENTIAL/PLATELET
BASOS ABS: 0.1 10*3/uL (ref 0–0.1)
Basophils Relative: 0 %
Eosinophils Absolute: 0 10*3/uL (ref 0–0.7)
Eosinophils Relative: 0 %
HEMATOCRIT: 29.3 % — AB (ref 35.0–47.0)
HEMOGLOBIN: 9.8 g/dL — AB (ref 12.0–16.0)
LYMPHS PCT: 8 %
Lymphs Abs: 1.4 10*3/uL (ref 1.0–3.6)
MCH: 34.3 pg — ABNORMAL HIGH (ref 26.0–34.0)
MCHC: 33.5 g/dL (ref 32.0–36.0)
MCV: 102.4 fL — AB (ref 80.0–100.0)
MONOS PCT: 5 %
Monocytes Absolute: 0.9 10*3/uL (ref 0.2–0.9)
Neutro Abs: 15.8 10*3/uL — ABNORMAL HIGH (ref 1.4–6.5)
Neutrophils Relative %: 87 %
PLATELETS: 103 10*3/uL — AB (ref 150–440)
RBC: 2.86 MIL/uL — AB (ref 3.80–5.20)
RDW: 15.6 % — ABNORMAL HIGH (ref 11.5–14.5)
WBC: 18.2 10*3/uL — AB (ref 3.6–11.0)

## 2015-02-06 LAB — COMPREHENSIVE METABOLIC PANEL
ALBUMIN: 4.4 g/dL (ref 3.5–5.0)
ALT: 71 U/L — ABNORMAL HIGH (ref 14–54)
AST: 72 U/L — ABNORMAL HIGH (ref 15–41)
Alkaline Phosphatase: 78 U/L (ref 38–126)
Anion gap: 5 (ref 5–15)
BUN: 26 mg/dL — AB (ref 6–20)
CO2: 25 mmol/L (ref 22–32)
CREATININE: 0.44 mg/dL (ref 0.44–1.00)
Calcium: 9.8 mg/dL (ref 8.9–10.3)
Chloride: 110 mmol/L (ref 101–111)
GLUCOSE: 153 mg/dL — AB (ref 65–99)
Potassium: 3.9 mmol/L (ref 3.5–5.1)
SODIUM: 140 mmol/L (ref 135–145)
TOTAL PROTEIN: 6.6 g/dL (ref 6.5–8.1)
Total Bilirubin: 18 mg/dL — ABNORMAL HIGH (ref 0.3–1.2)

## 2015-02-06 MED ORDER — LORAZEPAM 2 MG/ML IJ SOLN: 2.0000 mg | INTRAMUSCULAR | Status: AC | PRN

## 2015-02-06 MED ORDER — CHLORHEXIDINE GLUCONATE 0.12 % MT SOLN
15.0000 mL | Freq: Two times a day (BID) | OROMUCOSAL | Status: DC
  Administered 2015-02-06: 15 mL via OROMUCOSAL

## 2015-02-06 MED ORDER — CETYLPYRIDINIUM CHLORIDE 0.05 % MT LIQD
7.0000 mL | Freq: Two times a day (BID) | OROMUCOSAL | Status: DC
  Administered 2015-02-06: 7 mL via OROMUCOSAL

## 2015-02-06 NOTE — Progress Notes (Signed)
   02/06/15 1030  Clinical Encounter Type  Visited With Patient and family together  Visit Type Follow-up  Consult/Referral To Chaplain  Spiritual Encounters  Spiritual Needs Emotional;Grief support  Stress Factors  Patient Stress Factors Health changes  Family Stress Factors Health changes;Loss  Chaplain offered a compassionate presence and support to family as family grieved patient being transitioned to Hospice. Chaplain Jesse A. Cleston Lautner Ext. [phone]

## 2015-02-06 NOTE — Care Management Note (Signed)
Case Management Note  Patient Details  Name: Crystal [name] MRN: [id number] Date of Birth: Mar 31, 1971  Subjective/Objective:   TC to Dayna BarkerKaren Robertson [facility]. She will be in to further evaluate patient this morning.                  Action/Plan:   Expected Discharge Date:                  Expected Discharge Plan:     In-House Referral:     Discharge planning Services     Post Acute Care Choice:    Choice offered to:     DME Arranged:    DME Agency:     HH Arranged:    HH Agency:     Status of Service:     Medicare Important Message Given:    Date Medicare IM Given:    Medicare IM give by:    Date Additional Medicare IM Given:    Additional Medicare Important Message give by:     If discussed at Long Length of Stay Meetings, dates discussed:    Additional Comments:  Marily MemosLisa M Kenyana Husak, RN 02/06/2015, 8:52 AM

## 2015-02-06 NOTE — Clinical Social Work Placement (Signed)
   CLINICAL SOCIAL WORK PLACEMENT  NOTE  Date:  02/06/2015  Patient Details  Name: Crystal [name] MRN: [id number] Date of Birth: 25-Sep-1970  Clinical Social Work is seeking post-discharge placement for this patient at the [facility] level of care (*CSW will initial, date and re-position this form in  chart as items are completed):      Patient/family provided with [facility] Clinical Social Work Department's list of facilities offering this level of care within the geographic area requested by the patient (or if unable, by the patient's family).  Yes   Patient/family informed of their freedom to choose among providers that offer the needed level of care, that participate in Medicare, Medicaid or managed care program needed by the patient, have an available bed and are willing to accept the patient.  No [facility](Hospice House was already determined prior to this writer's involvement.  )   Patient/family informed of [place]'s ownership interest in [facility] and [facility], as well as of the fact that they are under no obligation to receive care at these facilities.  PASRR submitted to EDS on       PASRR number received on       Existing PASRR number confirmed on       FL2 transmitted to all facilities in geographic area requested by pt/family on       FL2 transmitted to all facilities within larger geographic area on       Patient informed that his/her managed care company has contracts with or will negotiate with certain facilities, including the following:        Yes   Patient/family informed of bed offers received.  Patient chooses bed at  [facility] already determined prior to this writer's involvement.  )     Physician recommends and patient chooses bed at      Patient to be transferred to Other - please specify in the comment section below: [facility](Hospice House) on 02/06/15.  Patient to be  transferred to facility by EMS     Patient family notified on 01/30/15 of transfer.  Name of family member notified:        PHYSICIAN Please prepare priority discharge summary, including medications     Additional Comment:  CSW was contacted by Clydie BraunKaren, Hospice RN to request assistance with discharge.  CSW copied all discharge documentation and put in envelope.  CSW was informed by Clydie BraunKaren, [facility] the lack of a physician discharge summary will not stop the patient from discharging at this time.  [facility]     _______________________________________________ [name], [name], LCSW 02/06/2015, 3:14 PM

## 2015-02-06 NOTE — Discharge Summary (Signed)
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 Patient was also lethargic and dehydrated and had an elevated white cell count of 23.3 and an elevated total bili of 26.6 on admission    Hospital Course:  Patient was admitted to [facility] received intravenous fluids and was also seen by Dr. Ricki RodriguezSkulski- Gastroenterologist . She was started on prednisone. For her elevated white cell count and possible sepsis she was also started on  Zosyn and Vancomycin She was also evaluated by nephrologist for Possible Hepato Renal Syndrome.  Patient was transferred to the CCU because of hypotension. She was also seen by Psychiatric wrist and Palliative care. She remained poorly responsive and her Se Ammionia levels and LFTS were persistently elevated  Family was also made aware of poor prognosis After extensive discussion with family, she was transferred in stable condition to hospice home for comfort care. Patient is DO NOT RESUSCITATE      Consultations: Dr. Donzetta StarchSkukski- GI Dr. Cherylann RatelLateef- Nephrologist  Discharge Exam: Filed Vitals:   02/06/15 1418  BP: 113/94  Pulse: 54  Temp:   Resp: 14    General: Jaundiced Cardiovascular: S1 S2 Respiratory: Clear to auscultation Neuro; Drowsy and poorly responsive    Discharge Instructions  Morphine Liquid 5 to 10 mg q 12 hrs prn for pain control  Discharge Medication List as of 02/06/2015  1:58 PM    START taking these medications   Details  LORazepam (ATIVAN) 2 MG/ML injection Inject 1-1.5 mLs (2-3 mg total) into the vein every hour as needed (alcohol withdrawal (dose based on CIWA-AR score))., Starting 02/06/2015, Until Discontinued, Print      STOP taking these medications     albuterol (VENTOLIN HFA) 108 (90 BASE) MCG/ACT inhaler      clonazePAM (KLONOPIN) 1 MG tablet      docusate Crystal (STOOL SOFTENER) 100 MG capsule      gabapentin (NEURONTIN) 600 MG tablet      ipratropium-albuterol (DUONEB) 0.5-2.5 (3) MG/3ML SOLN      lansoprazole (PREVACID) 30 MG capsule      levETIRAcetam (KEPPRA) 500 MG tablet      oxyCODONE (OXY IR/ROXICODONE) 5 MG immediate release tablet      promethazine (PHENERGAN) 25 MG tablet      SEROQUEL XR 150 MG 24 hr tablet      spironolactone (ALDACTONE) 50 MG tablet        Allergies  Allergen Reactions  . Oxycontin [Oxycodone Hcl] Itching  . Codeine Itching    Other reaction(s): Unknown      The results of significant diagnostics from this hospitalization (including imaging, microbiology, ancillary and laboratory) are listed below for reference.    Significant Diagnostic Studies: Ct Head Wo Contrast  01/30/2015   CLINICAL DATA:  Loss of motor skills over the last cerebral days.  Fevers. Mental status changes. History of aneurysm.  EXAM: CT HEAD WITHOUT CONTRAST  TECHNIQUE: Contiguous axial images were obtained from the base of the skull through the vertex without intravenous contrast.  COMPARISON:  10/27/2014 and multiple previous  FINDINGS: Previous right-sided pterional craniotomy. No aneurysm clip is visible by CT. The brain shows mild volume loss without evidence of old or acute focal infarction, mass lesion, hemorrhage, hydrocephalus or extra-axial collection. No acute calvarial finding. Sinuses,  middle ears and mastoids are clear.  IMPRESSION: No acute or reversible finding. Previous pterional craniotomy on the right. Mild brain atrophy without focal finding.   Electronically Signed   By: Paulina Fusi M.D.   On: 01/30/2015 13:00   US Abdomen Complete  01/30/2015   CLINICAL DATA:  Abdominal pain. Evaluate biliary tract and evaluate for ascites. Cirrhosis. Pancreatitis.  EXAM: ULTRASOUND ABDOMEN COMPLETE  COMPARISON:  CT of 08/30/2014  FINDINGS: Gallbladder: Minimal gallbladder sludge. Gallbladder wall thickening, 8 mm. Pericholecystic fluid. Sonographic Murphy's sign was not elicited.  Common bile duct: Diameter: Normal, 5 mm.  Liver: Moderate cirrhosis.  No focal liver lesion.  IVC: No abnormality visualized.  Pancreas: Not visualized due to overlying bowel gas.  Spleen: Splenomegaly, with the splenic volume of 480 cc.  Right Kidney: Length: 10.0 cm. Echogenicity within normal limits. No mass or hydronephrosis visualized.  Left Kidney: Length: 10.7 cm. Echogenicity within normal limits. No mass or hydronephrosis visualized.  Abdominal aorta: No aneurysm visualized.  Other findings: Small volume ascites, primarily identified adjacent the liver.  Heterogeneous flow within the portal vein, partially hepatofugal.  Mild degradation secondary to overlying bowel gas.  IMPRESSION: 1. Cirrhosis with portal venous hypertension, splenomegaly, and heterogeneous portal venous flow. 2. Gallbladder sludge with nonspecific gallbladder wall thickening (in the setting of portal venous hypertension) and pericholecystic fluid. No sonographic Murphy sign to confirm acute cholecystitis. 3. Small volume abdominal ascites. 4. Mild limitations secondary to overlying bowel gas.   Electronically Signed   By: Jeronimo Greaves M.D.   On: 01/30/2015 15:07   Dg Chest Port 1 View  02/02/2015   CLINICAL DATA:  Central line placement.  Hepatic encephalopathy.  EXAM: PORTABLE CHEST - 1 VIEW  COMPARISON:  01/30/2015  FINDINGS: Central line  tip is in the superior vena cava. No pneumothorax. Slight bibasilar atelectasis, new on the left and increased on the right. Heart size and pulmonary vascularity are normal.  IMPRESSION: Central line in good position. Increased atelectasis at the lung bases.   Electronically Signed   By: Francene Boyers M.D.   On: 02/02/2015 19:59   Dg Chest Portable 1 View  01/30/2015   CLINICAL DATA:  Generalize weakness. History of brain aneurysm. Smoker. Cirrhosis.  EXAM: PORTABLE CHEST - 1 VIEW  COMPARISON:  10/27/2014  FINDINGS: Midline trachea. Normal heart size. Mild right hemidiaphragm elevation. No pleural effusion or pneumothorax. New patchy right worse than left lower lung airspace disease.  IMPRESSION: New bibasilar opacities, favored to represent atelectasis. Early pneumonia felt less likely. Consider radiographic followup at 1-2 days.   Electronically Signed   By: Jeronimo Greaves M.D.   On: 01/30/2015 13:05    Microbiology: Recent Results (from the past 240 hour(s))  Urine culture     Status: None   Collection Time: 01/30/15 12:41 PM  Result Value Ref Range Status   Specimen Description URINE, CATHETERIZED  Final   Special Requests Normal  Final   Culture NO GROWTH 1 DAY  Final   Report Status 02/01/2015 FINAL  Final  Blood culture (routine x 2)     Status: None   Collection Time: 01/30/15  1:18 PM  Result Value Ref Range Status   Specimen Description RIGHT ANTECUBITAL  Final   Special Requests   Final    BOTTLES DRAWN AEROBIC AND ANAEROBIC  5 CC AEROBIC, .5 CC ANAEROBIC   Culture NO GROWTH 5 DAYS  Final   Report Status 02/04/2015 FINAL  Final  Blood culture (routine x 2)     Status: None   Collection Time: 01/30/15  1:20 PM  Result Value Ref Range Status   Specimen Description 4 BLOOD RIGHT  Final   Special Requests   Final    BOTTLES DRAWN AEROBIC AND ANAEROBIC  .5 CC AEROBIC, 2.5 CC ANAEROBIC   Culture NO GROWTH 5 DAYS  Final   Report Status 02/04/2015 FINAL  Final  MRSA PCR Screening      Status: None   Collection Time: 01/31/15 12:48 PM  Result Value Ref Range Status   MRSA by PCR NEGATIVE NEGATIVE Final    Comment:        The GeneXpert MRSA Assay (FDA approved for NASAL specimens only), is one component of a comprehensive MRSA colonization surveillance program. It is not intended to diagnose MRSA infection nor to guide or monitor treatment for MRSA infections.      Labs: Basic Metabolic Panel:  Recent Labs Lab 02/02/15 0650 02/03/15 0603 02/04/15 0516 02/05/15 0634 02/06/15 0549  NA 133* 135 140 138 140  K 3.9 4.5 3.8 4.2 3.9  CL 102 103 110 109 110  CO2 GLUCOSE 96 162* 83 146* 153*  BUN 21* 20 18 22* 26*  CREATININE UNABLE TO CALCULATE DUE TO ICTERUS 0.41* <0.30* 0.57 0.44  CALCIUM 8.7* 9.0 9.3 9.4 9.8   Liver Function Tests:  Recent Labs Lab 02/02/15 0650 02/03/15 0603 02/04/15 0516 02/05/15 0634 02/06/15 0549  AST 131* 111* 143* 92* 72*  ALT 106* 86* 86* 73* 71*  ALKPHOS 114 99 77 72 78  BILITOT 22.5* 21.3* 20.3* 20.1* 18.0*  PROT 6.5 6.3* 6.2* 6.5 6.6  ALBUMIN 3.3* 3.5 3.8 4.1 4.4   No results for input(s): LIPASE, AMYLASE in the last 168 hours.  Recent Labs Lab 02/01/15 1602 02/04/15 0516  AMMONIA 91* 70*   CBC:  Recent Labs Lab 02/02/15 0650 02/03/15 0603 02/04/15 0516 02/05/15 0634 02/06/15 0549  WBC 13.9* 8.8 13.0* 15.7* 18.2*  NEUTROABS 10.8* 6.9* 10.5* 13.4* 15.8*  HGB 10.3* 10.2* 10.0* 10.1* 9.8*  HCT 30.0* 30.1* 29.1* 29.9* 29.3*  MCV 101.7* 101.9* 102.5* 103.2* 102.4*  PLT 130* 111* 110* 107* 103*   Cardiac Enzymes:  Recent Labs Lab 01/31/15 0406  TROPONINI <0.03   BNP: BNP (last 3 results) No results for input(s): BNP in the last 8760 hours.  ProBNP (last 3 results) No results for input(s): PROBNP in the last 8760 hours.  CBG:  Recent Labs Lab 01/31/15 1241  GLUCAP 139*       Signed:  Roxy Mastandrea   02/06/2015, 5:49 PM

## 2015-02-06 NOTE — Progress Notes (Signed)
   02/06/15 1337  Clinical Encounter Type  Visited With Patient and family together  Visit Type Spiritual support  Consult/Referral To Chaplain  Spiritual Encounters  Spiritual Needs Grief support  Stress Factors  Patient Stress Factors Health changes  Family Stress Factors Loss  Chaplain went in to check on family and patient to provide compassionate presence and support as applicable. Chaplain Marsela A. Johntae Broxterman Ext. [phone]

## 2015-02-06 NOTE — Care Management Note (Signed)
Case Management Note  Patient Details  Name: Crystal [name] MRN: [id number] Date of Birth: [birth year]  Subjective/Objective:  Met with spouse and niece. They have been to the hospice home and have signed paperwork. It was further clarified with the family that all the current gtt's she is on would be discontinued and they are agreeable.  Hospice liaison updated and is in route.                 Action/Plan:   Expected Discharge Date:                  Expected Discharge Plan:     In-House Referral:     Discharge planning Services     Post Acute Care Choice:    Choice offered to:     DME Arranged:    DME Agency:     HH Arranged:    [place] Agency:     Status of Service:     Medicare Important Message Given:    Date Medicare IM Given:    Medicare IM give by:    Date Additional Medicare IM Given:    Additional Medicare Important Message give by:     If discussed at [place] of Stay Meetings, dates discussed:    Additional Comments:  Jolly Mango, RN 02/06/2015, 9:50 AM

## 2015-02-06 NOTE — Progress Notes (Signed)
Moaning prior to transfer from bed to stretcher, therefore IV morphine was given per PRN order for pain. Aroused from being transferred from bed to stretcher.  Discharge packet with yellow DNR sheet given to EMS staff. [facility]. [facility]. Family aware and at hospital at time of pick up.  Crystal [name]

## 2015-02-06 NOTE — Progress Notes (Signed)
PROGRESS NOTE  Crystal [name] ZOX:[id number] DOB: [birth year] DOA: 01/30/2015 PCP: Crystal Reichmann, MD  Subjective:  44 y/o F with hx of Alcohol abuse, Bipolar disorder, Seizures from alcohol withdrawal,Anxiety, ADHD admitted with abdominal pain and altered mental status with increased lethargy consistent with Hepatic encephalopathy. Pt is jaundiced. Sedated  Se Bilirubin is 18, Ammonia: 70  Consultants: GI- Dr. Ricki [name]   Objective: BP 116/87 mmHg  Pulse 48  Temp(Src) 98.1 F (36.7 C) (Axillary)  Resp 12  Ht  (1.6 m)  Wt 67.132 kg (148 lb)  BMI 26.22 kg/m2  SpO2 97%  LMP   Intake/Output Summary (Last 24 hours) at 02/06/15 0824 Last data filed at 02/06/15 0800  Gross per 24 hour  Intake  118.5 ml  Output    200 ml  Net  -81.5 ml   Filed Weights   01/30/15 1128  Weight: 67.132 kg (148 lb)    Exam:   General: Jaundiced. Sedated  Spider nevi and peteciae noted   Cardiovascular: S1 S2   Respiratory: Clear to auscultation  Abdomen: distended/ Ascites +  Neuro:Speech is slurred and incoherent   Data Reviewed: Basic Metabolic Panel:  Recent Labs Lab 02/02/15 0650 02/03/15 0603 02/04/15 0516 02/05/15 0634 02/06/15 0549  NA 133* 135 140 138 140  K 3.9 4.5 3.8 4.2 3.9  CL 102 103 110 109 110  CO2 GLUCOSE 96 162* 83 146* 153*  BUN 21* 20 18 22* 26*  CREATININE UNABLE TO CALCULATE DUE TO ICTERUS 0.41* <0.30* 0.57 0.44  CALCIUM 8.7* 9.0 9.3 9.4 9.8   Liver Function Tests:  Recent Labs Lab 02/02/15 0650 02/03/15 0603 02/04/15 0516 02/05/15 0634 02/06/15 0549  AST 131* 111* 143* 92* 72*  ALT 106* 86* 86* 73* 71*  ALKPHOS 114 99 77 72 78  BILITOT 22.5* 21.3* 20.3* 20.1* 18.0*  PROT 6.5 6.3* 6.2* 6.5 6.6  ALBUMIN 3.3* 3.5 3.8 4.1 4.4    Recent Labs Lab 01/30/15 1155  LIPASE 34    Recent Labs Lab 01/30/15 1240 02/01/15 1602 02/04/15 0516  AMMONIA 109* 91* 70*   CBC:  Recent Labs Lab 02/02/15 0650  02/03/15 0603 02/04/15 0516 02/05/15 0634 02/06/15 0549  WBC 13.9* 8.8 13.0* 15.7* 18.2*  NEUTROABS 10.8* 6.9* 10.5* 13.4* 15.8*  HGB 10.3* 10.2* 10.0* 10.1* 9.8*  HCT 30.0* 30.1* 29.1* 29.9* 29.3*  MCV 101.7* 101.9* 102.5* 103.2* 102.4*  PLT 130* 111* 110* 107* 103*   Cardiac Enzymes:    Recent Labs Lab 01/30/15 1155 01/30/15 1728 01/31/15 0406  TROPONINI 0.07* 0.07* <0.03   BNP (last 3 results) No results for input(s): BNP in the last 8760 hours.  ProBNP (last 3 results) No results for input(s): PROBNP in the last 8760 hours.  CBG:  Recent Labs Lab 01/31/15 1241  GLUCAP 139*    Recent Results (from the past 240 hour(s))  Urine culture     Status: None   Collection Time: 01/30/15 12:41 PM  Result Value Ref Range Status   Specimen Description URINE, CATHETERIZED  Final   Special Requests Normal  Final   Culture NO GROWTH 1 DAY  Final   Report Status 02/01/2015 FINAL  Final  Blood culture (routine x 2)     Status: None   Collection Time: 01/30/15  1:18 PM  Result Value Ref Range Status   Specimen Description RIGHT ANTECUBITAL  Final   Special Requests   Final    BOTTLES DRAWN AEROBIC AND ANAEROBIC  5 CC AEROBIC, .5 CC ANAEROBIC   Culture NO GROWTH 5 DAYS  Final   Report Status 02/04/2015 FINAL  Final  Blood culture (routine x 2)     Status: None   Collection Time: 01/30/15  1:20 PM  Result Value Ref Range Status   Specimen Description 4 BLOOD RIGHT  Final   Special Requests   Final    BOTTLES DRAWN AEROBIC AND ANAEROBIC  .5 CC AEROBIC, 2.5 CC ANAEROBIC   Culture NO GROWTH 5 DAYS  Final   Report Status 02/04/2015 FINAL  Final  MRSA PCR Screening     Status: None   Collection Time: 01/31/15 12:48 PM  Result Value Ref Range Status   MRSA by PCR NEGATIVE NEGATIVE Final    Comment:        The GeneXpert MRSA Assay (FDA approved for NASAL specimens only), is one component of a comprehensive MRSA colonization surveillance program. It is not intended to  diagnose MRSA infection nor to guide or monitor treatment for MRSA infections.      Studies: No results found.  Scheduled Meds: . albumin human  25 g Intravenous Q8H  . antiseptic oral rinse  7 mL Mouth Rinse q12n4p  . chlorhexidine  15 mL Mouth Rinse BID  . feeding supplement (RESOURCE BREEZE)  1 Container Oral TID WC  . folic acid  1 mg Oral Daily  . lactulose  30 g Oral TID  . levETIRAcetam  500 mg Oral BID  . methylPREDNISolone (SOLU-MEDROL) injection  40 mg Intravenous Daily  . multivitamin with minerals  1 tablet Oral Daily  . pantoprazole (PROTONIX) IV  40 mg Intravenous Q12H  . piperacillin-tazobactam  3.375 g Intravenous 3 times per day  . QUEtiapine Fumarate  150 mg Oral QHS  . rifaximin  550 mg Oral BID  . sodium chloride  3 mL Intravenous Q12H  . thiamine  100 mg Oral Daily  . vancomycin  1,000 mg Intravenous Q18H    Continuous Infusions: . sodium chloride 50 mL/hr at 02/06/15 0600  . dexmedetomidine 0.6 mcg/kg/hr (02/06/15 0600)  . norepinephrine      Assessment/Plan:  1 Altered mental status secondary to acute Hepatic encephalopathy and Cirrhosis of liver   -On lactulose and Prednisone  LFT's improving 2 Anxiety and agitation secondary to DT's: On Precedex- 3 Leukocytosis: Possible sepsis- Improved-- On Zosyn and Vanc  4 Elevated Troponin secondary to Demand ischemia  5 Seizure disorder: On Keppra  6 Acute Renal failure/ Hypotension and Metabolic acidosis  with possible  Hepatorenal syndrome Continue IV hydration 7 Hyponatremia; Resolved  Prognosis is poor. Pt is DNR Family is considering [facility]      Code Status: DNR  Family Communication: neice     Crystal [name]   02/06/2015, 8:24 AM  LOS: 7 days

## 2015-02-06 NOTE — Progress Notes (Signed)
Follow up visit made on new Hospice Home referral received over the week end. Patient seen lying in bed, eyes closed, currently on sedation medication. Writer spoke with staff RN [place]Brittany regarding sedation medication, she reports this to be stopped for transfer to the Hospice home. Medication titrated and discontinued.  G[place]adaBrittany reports that [name] does groan with movement and has been given IV morphine for pain management. Oxygen on at 2 liters via nasal cannula. Patient's friend Darl PikesSusan at bedside, aware of plan for transport to the hospice home for comfort. Darl PikesSusan stated that Ms. Lavone Neriarker's husband Apolonio SchneidersDonald Polak had gone home, she will contact him when patient is transported. Paper work signed at the hospice home yesterday (7/10). Updated information faxed to referral. Writer [facility] with [facility] Misty StanleyLisa regarding need for signed portable DNR and discharge orders. Report called to hospice home. Staff RN [place]Brittany to call EMS for transport when discharge paperwork in place. Thank you for the opportunity to serve [name] and her family. Dayna BarkerKaren Robertson RN, BSN, [facility] [place] [facility] [place], [facility] lia[facility]  [phone] c

## 2015-02-27 DEATH — deceased

## 2017-04-10 IMAGING — CR DG CHEST 1V PORT
1 series · 1 of 1 positions shown · non-contrast
Comparison: 01/30/2015

CLINICAL DATA: Central line placement.  Hepatic encephalopathy.

EXAM:
PORTABLE CHEST - 1 VIEW

[portable]
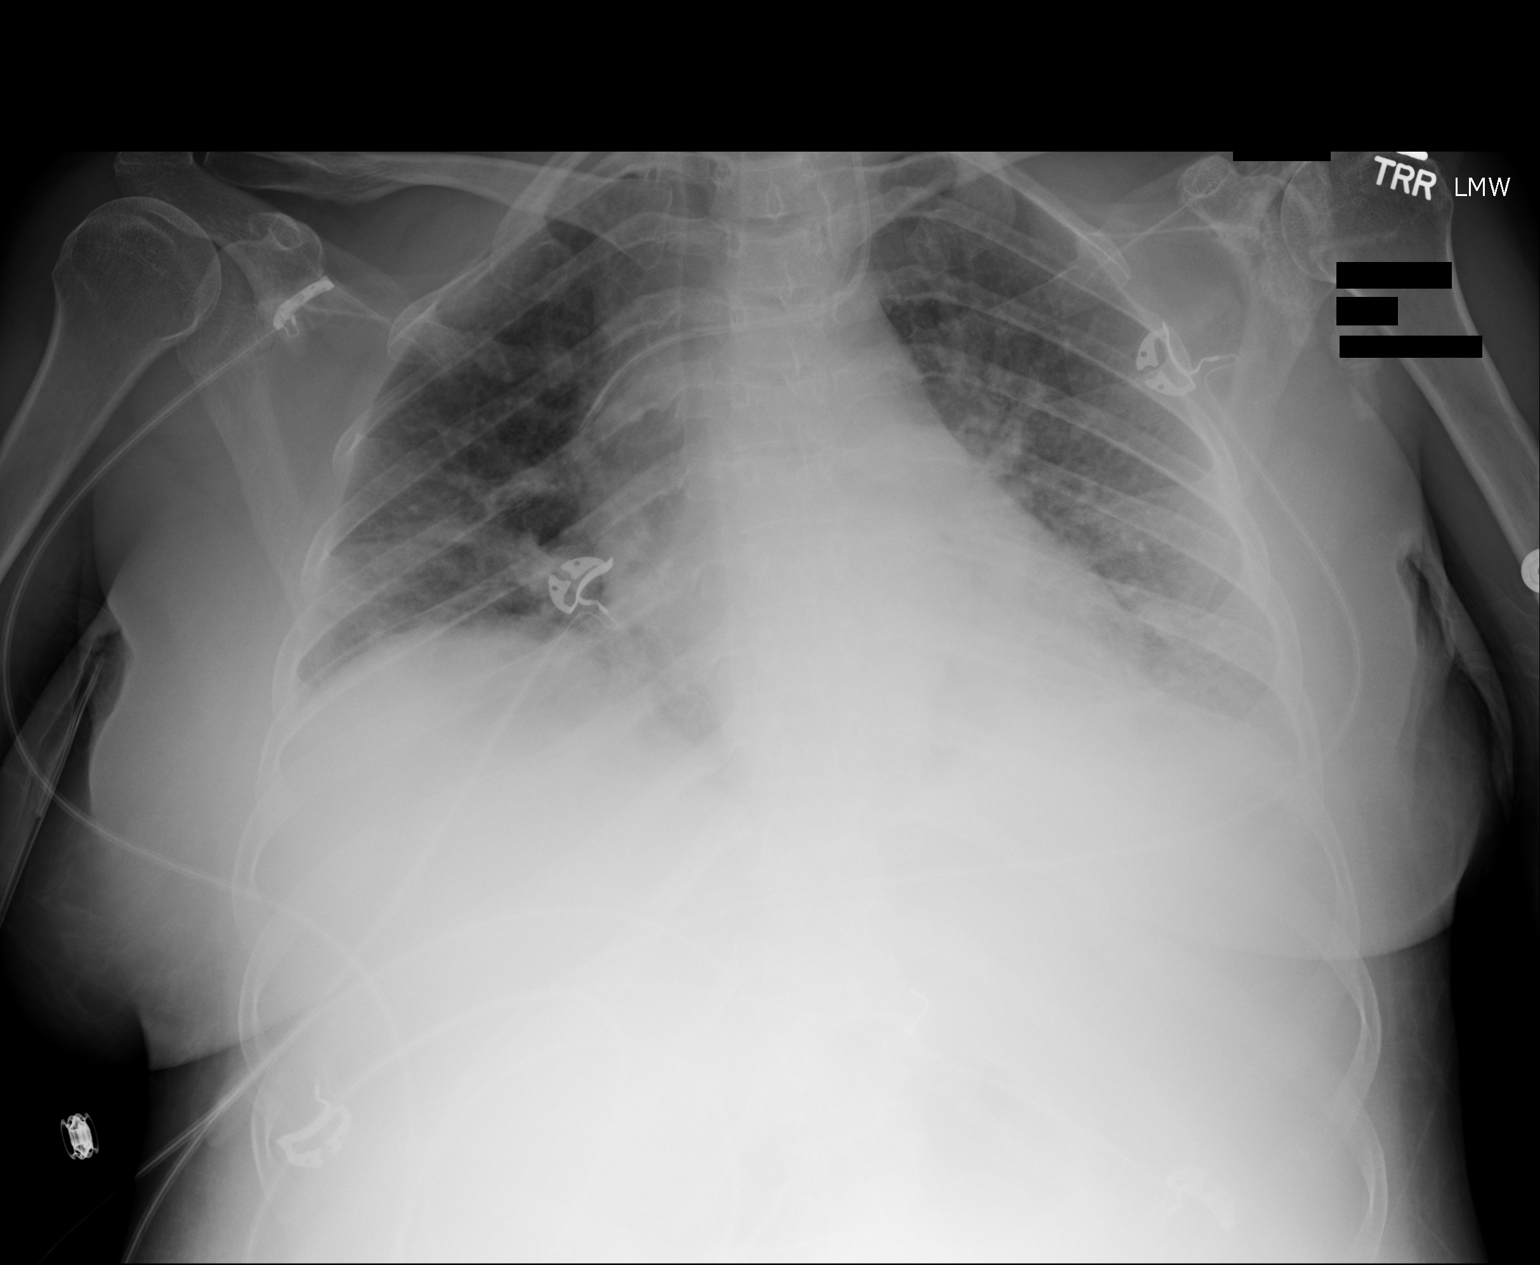

[1 of 1 positions shown; findings below may reference images not displayed]

FINDINGS: Central line tip is in the superior vena cava. No pneumothorax.
Slight bibasilar atelectasis, new on the left and increased on the
right. Heart size and pulmonary vascularity are normal.
IMPRESSION: Central line in good position. Increased atelectasis at the lung
bases.
